# Patient Record
Sex: Male | Born: 1995 | Race: White | Hispanic: No | Marital: Single | Smoking: Never smoker
Health system: Southern US, Community
[De-identification: ages and names within clinical notes are randomized; demographics above are authoritative.]

## PROBLEM LIST (undated history)

## (undated) VITALS — BP 124/63 | HR 83 | Temp 97.5°F | Resp 18 | Ht 72.64 in | Wt 280.0 lb

## (undated) VITALS — BP 137/69 | HR 118 | Temp 97.1°F | Resp 18 | Ht 72.84 in | Wt 260.1 lb

## (undated) DIAGNOSIS — S62109A Fracture of unspecified carpal bone, unspecified wrist, initial encounter for closed fracture: Secondary | ICD-10-CM

## (undated) DIAGNOSIS — R51 Headache: Secondary | ICD-10-CM

## (undated) DIAGNOSIS — F329 Major depressive disorder, single episode, unspecified: Secondary | ICD-10-CM

## (undated) DIAGNOSIS — S82899A Other fracture of unspecified lower leg, initial encounter for closed fracture: Secondary | ICD-10-CM

## (undated) DIAGNOSIS — E669 Obesity, unspecified: Secondary | ICD-10-CM

## (undated) DIAGNOSIS — G930 Cerebral cysts: Secondary | ICD-10-CM

## (undated) DIAGNOSIS — H539 Unspecified visual disturbance: Secondary | ICD-10-CM

## (undated) DIAGNOSIS — F32A Depression, unspecified: Secondary | ICD-10-CM

## (undated) DIAGNOSIS — K760 Fatty (change of) liver, not elsewhere classified: Secondary | ICD-10-CM

## (undated) DIAGNOSIS — H05812 Cyst of left orbit: Secondary | ICD-10-CM

## (undated) DIAGNOSIS — J45909 Unspecified asthma, uncomplicated: Secondary | ICD-10-CM

## (undated) DIAGNOSIS — S2239XA Fracture of one rib, unspecified side, initial encounter for closed fracture: Secondary | ICD-10-CM

## (undated) DIAGNOSIS — F909 Attention-deficit hyperactivity disorder, unspecified type: Secondary | ICD-10-CM

## (undated) DIAGNOSIS — F419 Anxiety disorder, unspecified: Secondary | ICD-10-CM

## (undated) HISTORY — DX: Major depressive disorder, single episode, unspecified: F32.9

## (undated) HISTORY — PX: ADENOIDECTOMY: SUR15

## (undated) HISTORY — DX: Depression, unspecified: F32.A

---
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
## 2016-10-06 ENCOUNTER — Emergency Department (HOSPITAL_COMMUNITY)
Admission: EM | Admit: 2016-10-06 | Discharge: 2016-10-06 | Disposition: A | Discharge status: Other Acute Inpatient Hospital | Payer: Self-pay | Attending: Emergency Medicine | Admitting: Emergency Medicine

## 2016-10-06 ENCOUNTER — Encounter (HOSPITAL_COMMUNITY): Payer: Self-pay | Admitting: Emergency Medicine

## 2016-10-06 ENCOUNTER — Inpatient Hospital Stay (HOSPITAL_COMMUNITY)
Admission: RE | Admit: 2016-10-06 | Discharge: 2016-10-13 | DRG: 885 | Disposition: A | Discharge status: Home / Self Care | Payer: Federal, State, Local not specified - Other | Source: Intra-hospital | Attending: Psychiatry | Admitting: Psychiatry

## 2016-10-06 DIAGNOSIS — F332 Major depressive disorder, recurrent severe without psychotic features: Principal | ICD-10-CM | POA: Diagnosis present

## 2016-10-06 DIAGNOSIS — R45851 Suicidal ideations: Secondary | ICD-10-CM | POA: Insufficient documentation

## 2016-10-06 DIAGNOSIS — Z79899 Other long term (current) drug therapy: Secondary | ICD-10-CM | POA: Diagnosis not present

## 2016-10-06 DIAGNOSIS — F39 Unspecified mood [affective] disorder: Secondary | ICD-10-CM | POA: Diagnosis not present

## 2016-10-06 DIAGNOSIS — F902 Attention-deficit hyperactivity disorder, combined type: Secondary | ICD-10-CM | POA: Diagnosis present

## 2016-10-06 DIAGNOSIS — R45850 Homicidal ideations: Secondary | ICD-10-CM | POA: Insufficient documentation

## 2016-10-06 DIAGNOSIS — Z915 Personal history of self-harm: Secondary | ICD-10-CM

## 2016-10-06 DIAGNOSIS — J4540 Moderate persistent asthma, uncomplicated: Secondary | ICD-10-CM | POA: Diagnosis present

## 2016-10-06 DIAGNOSIS — F419 Anxiety disorder, unspecified: Secondary | ICD-10-CM | POA: Diagnosis not present

## 2016-10-06 DIAGNOSIS — F6381 Intermittent explosive disorder: Secondary | ICD-10-CM | POA: Diagnosis not present

## 2016-10-06 DIAGNOSIS — I10 Essential (primary) hypertension: Secondary | ICD-10-CM | POA: Insufficient documentation

## 2016-10-06 DIAGNOSIS — Z9114 Patient's other noncompliance with medication regimen: Secondary | ICD-10-CM | POA: Insufficient documentation

## 2016-10-06 DIAGNOSIS — F4310 Post-traumatic stress disorder, unspecified: Secondary | ICD-10-CM | POA: Diagnosis present

## 2016-10-06 DIAGNOSIS — R443 Hallucinations, unspecified: Secondary | ICD-10-CM | POA: Diagnosis not present

## 2016-10-06 DIAGNOSIS — J45909 Unspecified asthma, uncomplicated: Secondary | ICD-10-CM | POA: Insufficient documentation

## 2016-10-06 DIAGNOSIS — Z046 Encounter for general psychiatric examination, requested by authority: Secondary | ICD-10-CM | POA: Insufficient documentation

## 2016-10-06 DIAGNOSIS — R450 Nervousness: Secondary | ICD-10-CM | POA: Diagnosis not present

## 2016-10-06 DIAGNOSIS — Z008 Encounter for other general examination: Secondary | ICD-10-CM

## 2016-10-06 DIAGNOSIS — G4700 Insomnia, unspecified: Secondary | ICD-10-CM | POA: Diagnosis not present

## 2016-10-06 DIAGNOSIS — Z888 Allergy status to other drugs, medicaments and biological substances status: Secondary | ICD-10-CM

## 2016-10-06 LAB — SALICYLATE LEVEL: Salicylate Lvl: <7 mg/dL (ref 2.8–30.0)

## 2016-10-06 LAB — COMPREHENSIVE METABOLIC PANEL
ALT: 53 U/L (ref 17–63)
AST: 34 U/L (ref 15–41)
Albumin: 4.2 g/dL (ref 3.5–5.0)
Alkaline Phosphatase: 39 U/L (ref 38–126)
Anion gap: 9 (ref 5–15)
BILIRUBIN TOTAL: 1.2 mg/dL (ref 0.3–1.2)
BUN: 13 mg/dL (ref 6–20)
CO2: 24 mmol/L (ref 22–32)
Calcium: 9.1 mg/dL (ref 8.9–10.3)
Chloride: 104 mmol/L (ref 101–111)
Creatinine, Ser: 0.95 mg/dL (ref 0.61–1.24)
Glucose, Bld: 97 mg/dL (ref 65–99)
Potassium: 3.9 mmol/L (ref 3.5–5.1)
Sodium: 137 mmol/L (ref 135–145)
TOTAL PROTEIN: 7.5 g/dL (ref 6.5–8.1)

## 2016-10-06 LAB — RAPID URINE DRUG SCREEN, HOSP PERFORMED
Amphetamines: NOT DETECTED
Barbiturates: NOT DETECTED
Benzodiazepines: NOT DETECTED
Cocaine: NOT DETECTED
Opiates: NOT DETECTED
Tetrahydrocannabinol: POSITIVE — AB

## 2016-10-06 LAB — CBC
HCT: 41.8 % (ref 39.0–52.0)
Hemoglobin: 14.1 g/dL (ref 13.0–17.0)
MCH: 27.8 pg (ref 26.0–34.0)
MCHC: 33.7 g/dL (ref 30.0–36.0)
MCV: 82.4 fL (ref 78.0–100.0)
PLATELETS: 198 10*3/uL (ref 150–400)
RBC: 5.07 MIL/uL (ref 4.22–5.81)
RDW: 13.5 % (ref 11.5–15.5)
WBC: 7.9 10*3/uL (ref 4.0–10.5)

## 2016-10-06 LAB — ETHANOL

## 2016-10-06 LAB — ACETAMINOPHEN LEVEL: Acetaminophen (Tylenol), Serum: <10 ug/mL — ABNORMAL LOW (ref 10–30)

## 2016-10-06 MED ORDER — TRAZODONE HCL 100 MG PO TABS
100.0000 mg | ORAL_TABLET | Freq: Every evening | ORAL | Status: DC | PRN
  Administered 2016-10-06 – 2016-10-12 (×7): 100 mg via ORAL
  Filled 2016-10-06 (×4): qty 1
  Filled 2016-10-06: qty 7
  Filled 2016-10-06 (×3): qty 1

## 2016-10-06 MED ORDER — MAGNESIUM HYDROXIDE 400 MG/5ML PO SUSP: 30.0000 mL | Freq: Every day | ORAL | Status: DC | PRN

## 2016-10-06 MED ORDER — ACETAMINOPHEN 325 MG PO TABS: 650.0000 mg | ORAL_TABLET | Freq: Four times a day (QID) | ORAL | Status: DC | PRN

## 2016-10-06 MED ORDER — PROPRANOLOL HCL 10 MG PO TABS
10.0000 mg | ORAL_TABLET | Freq: Three times a day (TID) | ORAL | Status: DC
  Administered 2016-10-07 – 2016-10-13 (×17): 10 mg via ORAL
  Filled 2016-10-06 (×23): qty 1

## 2016-10-06 MED ORDER — HYDROXYZINE HCL 25 MG PO TABS
25.0000 mg | ORAL_TABLET | Freq: Four times a day (QID) | ORAL | Status: DC | PRN
  Administered 2016-10-07 – 2016-10-12 (×4): 25 mg via ORAL
  Filled 2016-10-06 (×2): qty 1
  Filled 2016-10-06: qty 10
  Filled 2016-10-06 (×2): qty 1

## 2016-10-06 MED ORDER — ZOLPIDEM TARTRATE 5 MG PO TABS: 5.0000 mg | ORAL_TABLET | Freq: Every evening | ORAL | Status: DC | PRN

## 2016-10-06 MED ORDER — ALBUTEROL SULFATE HFA 108 (90 BASE) MCG/ACT IN AERS: 2.0000 | INHALATION_SPRAY | RESPIRATORY_TRACT | Status: DC | PRN

## 2016-10-06 MED ORDER — PROPRANOLOL HCL 10 MG PO TABS
10.0000 mg | ORAL_TABLET | Freq: Three times a day (TID) | ORAL | Status: DC
  Administered 2016-10-06: 10 mg via ORAL
  Filled 2016-10-06 (×2): qty 1

## 2016-10-06 MED ORDER — TRAZODONE HCL 100 MG PO TABS: 100.0000 mg | ORAL_TABLET | Freq: Every evening | ORAL | Status: DC | PRN

## 2016-10-06 MED ORDER — HYDROXYZINE HCL 25 MG PO TABS: 25.0000 mg | ORAL_TABLET | Freq: Four times a day (QID) | ORAL | Status: DC | PRN

## 2016-10-06 MED ORDER — IBUPROFEN 200 MG PO TABS: 600.0000 mg | ORAL_TABLET | Freq: Three times a day (TID) | ORAL | Status: DC | PRN

## 2016-10-06 MED ORDER — ONDANSETRON HCL 4 MG PO TABS: 4.0000 mg | ORAL_TABLET | Freq: Three times a day (TID) | ORAL | Status: DC | PRN

## 2016-10-06 MED ORDER — ALBUTEROL SULFATE HFA 108 (90 BASE) MCG/ACT IN AERS
2.0000 | INHALATION_SPRAY | RESPIRATORY_TRACT | Status: DC | PRN
  Filled 2016-10-06: qty 6.7

## 2016-10-06 MED ORDER — ALUM & MAG HYDROXIDE-SIMETH 200-200-20 MG/5ML PO SUSP: 30.0000 mL | Freq: Four times a day (QID) | ORAL | Status: DC | PRN
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
## 2016-10-06 NOTE — BH Assessment (Addendum)
Patient meets criteria for INPT treatment. [facility] Elta GuadeloupeLaurie Parks, NP. Room assignment is 403-1 after 1800. Support paperwork completed.
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
## 2016-10-06 NOTE — ED Notes (Signed)
Pt transported to [facility] by [facility]. All belongings returned to pt who signed for same. Pt was calm and cooperative.  

## 2016-10-07 ENCOUNTER — Encounter (HOSPITAL_COMMUNITY): Payer: Self-pay | Admitting: *Deleted

## 2016-10-07 DIAGNOSIS — F332 Major depressive disorder, recurrent severe without psychotic features: Principal | ICD-10-CM

## 2016-10-07 DIAGNOSIS — R45851 Suicidal ideations: Secondary | ICD-10-CM

## 2016-10-07 DIAGNOSIS — F6381 Intermittent explosive disorder: Secondary | ICD-10-CM

## 2016-10-07 DIAGNOSIS — Z62810 Personal history of physical and sexual abuse in childhood: Secondary | ICD-10-CM

## 2016-10-07 DIAGNOSIS — R45850 Homicidal ideations: Secondary | ICD-10-CM

## 2016-10-07 MED ORDER — CARBAMAZEPINE 200 MG PO TABS
200.0000 mg | ORAL_TABLET | Freq: Two times a day (BID) | ORAL | Status: DC
  Administered 2016-10-07 – 2016-10-13 (×12): 200 mg via ORAL
  Filled 2016-10-07 (×14): qty 1

## 2016-10-07 MED ORDER — QUETIAPINE FUMARATE 50 MG PO TABS
50.0000 mg | ORAL_TABLET | Freq: Every day | ORAL | Status: DC
  Administered 2016-10-07 – 2016-10-08 (×2): 50 mg via ORAL
  Filled 2016-10-07 (×4): qty 1

## 2016-10-07 MED ORDER — SERTRALINE HCL 50 MG PO TABS
50.0000 mg | ORAL_TABLET | Freq: Every day | ORAL | Status: DC
  Administered 2016-10-07 – 2016-10-13 (×7): 50 mg via ORAL
  Filled 2016-10-07 (×9): qty 1

## 2016-10-07 MED ORDER — QUETIAPINE FUMARATE 25 MG PO TABS
25.0000 mg | ORAL_TABLET | Freq: Two times a day (BID) | ORAL | Status: DC
  Administered 2016-10-07 – 2016-10-13 (×12): 25 mg via ORAL
  Filled 2016-10-07 (×15): qty 1

## 2016-10-07 MED ORDER — IBUPROFEN 600 MG PO TABS
600.0000 mg | ORAL_TABLET | Freq: Four times a day (QID) | ORAL | Status: DC | PRN
  Administered 2016-10-07 – 2016-10-10 (×5): 600 mg via ORAL
  Filled 2016-10-07 (×5): qty 1

## 2016-10-07 NOTE — Plan of Care (Signed)
Problem: Education: Goal: Verbalization of understanding the information provided will improve Outcome: Progressing Patient verbalizes understanding of information, education provided thus far.  Problem: Safety: Goal: Periods of time without injury will increase Outcome: Progressing Patient does endorse passive SI however no thoughts to self harm. Verbal contract in place for safety.

## 2016-10-07 NOTE — [facility] Group Notes (Signed)
LCSW Group Therapy Note  10/07/2016 1:15pm  Type of Therapy/Topic:  Group Therapy:  Balance in Life  Participation Level:  Did Not Attend-pt invited. Chose to remain in his room.   Description of Group:    This group will address the concept of balance and how it feels and looks when one is unbalanced. Patients will be encouraged to process areas in their lives that are out of balance and identify reasons for remaining unbalanced. Facilitators will guide patients in utilizing problem-solving interventions to address and correct the stressor making their life unbalanced. Understanding and applying boundaries will be explored and addressed for obtaining and maintaining a balanced life. Patients will be encouraged to explore ways to assertively make their unbalanced needs known to significant others in their lives, using other group members and facilitator for support and feedback.  Therapeutic Goals: 1. Patient will identify two or more emotions or situations they have that consume much of in their lives. 2. Patient will identify signs/triggers that life has become out of balance:  3. Patient will identify two ways to set boundaries in order to achieve balance in their lives:  4. Patient will demonstrate ability to communicate their needs through discussion and/or role plays  Summary of Patient Progress:      Therapeutic Modalities:   Cognitive Behavioral Therapy Solution-Focused Therapy Assertiveness Training  Ledell PeoplesHeather N Smart, LCSW 10/07/2016 1:59 PM

## 2016-10-07 NOTE — Progress Notes (Signed)
D: Patient observed up and visible in the milieu. Pacing and restless at times. Frequent contacts made 1:1 throughout morning. Patient verbalizes to this writer mid morning, "I'm starting to shake inside. That usually means it's imminent." Patient walking and talking with peer in hall who is also anxious and asking for prns. Patient was observed socializing, playing cards with peers a short time prior to this reported anxiety. Patient is childlike, thinking is concrete. Patient's affect animated, anxious with congruent mood. Per self inventory and discussions with writer, rates depression at a 7/10, hopelessness at a 1/10 and anxiety at a 3/10. Rates sleep as fair, appetite as good, energy as normal and concentration as poor.  States goal for today is "anger control, find different ways to cope." Reporting chronic L shoulder pain of a 5/10, no other physical problems.   A: Medicated per orders, prn tylenol offered but patient refuses. Vistaril was given for anxiety. Strongly encouraged use of coping skills and education provided on available techniques. Level III obs in place for safety. Emotional support offered and self inventory reviewed. Encouraged completion of Suicide Safety Plan and programming participation. Discussed POC with MD, SW.  R: Patient verbalizes understanding of POC. On reassess, patient calmer and did go outside with peers. Patient endorsing passive SI however verbally contracts for safety. "I will come talk to someone." Continues to endorse thoughts of harming family. No AVH and remains safe on level III obs. Will continue to monitor closely and make verbal contact frequently.

## 2016-10-07 NOTE — [facility] Suicide Risk Assessment (Signed)
[facility]   Nursing information obtained from:  Family Demographic factors:  Male, Adolescent or young adult, Caucasian, Low socioeconomic status Current Mental Status:  Suicidal ideation indicated by patient, Self-harm thoughts, Self-harm behaviors Loss Factors:  Financial problems / change in socioeconomic status Historical Factors:  Prior suicide attempts Risk Reduction Factors:  Pregnancy  Total Time spent with patient: 45 minutes Principal Problem:  MDD, no psychotic features, consider Intermittent Explosive Disorder  Diagnosis:   Patient Active Problem List   Diagnosis Date Noted  . MDD (major depressive disorder), recurrent severe, without psychosis (HCC) [F33.2] 10/06/2016  . Intermittent explosive disorder [F63.81] 09/30/2015  . Moderate persistent asthma [J45.40] 12/20/2014  . Other allergic rhinitis [J30.89] 12/20/2014  . PTSD (post-traumatic stress disorder) [F43.10] 04/22/2012  . ADHD (attention deficit hyperactivity disorder), combined type [F90.2] 04/19/2012  . ODD (oppositional defiant disorder) [F91.3] 04/19/2012    Continued Clinical Symptoms:  Alcohol Use Disorder Identification Test Final Score (AUDIT): 0 The "Alcohol Use Disorders Identification Test", Guidelines for Use in Primary Care, Second Edition.  World [employer] [facility](WHO). Score between 0-7:  no or low risk or alcohol related problems. Score between 8-15:  moderate risk of alcohol related problems. Score between 16-19:  high risk of alcohol related problems. Score 20 or above:  warrants further diagnostic evaluation for alcohol dependence and treatment.   CLINICAL FACTORS:  21 year old single male, presents voluntarily due to worsening depression,neuro-vegetative symptoms, and suicidal and homicidal ideations.  Reports SI and HI towards family members ( mother, great - grandparents ) History of prior psychiatric admissions. Has been off psychiatric medications for  several months  Denies drug or alcohol abuse     Psychiatric Specialty Exam: Physical Exam  ROS  Blood pressure (!) 127/107, pulse (!) 112, temperature (!) 97.5 F (36.4 C), temperature source Oral, resp. rate 18, height 6' (1.829 m), weight (!) 147.9 kg (326 lb).Body mass index is 44.21 kg/m.   see admit note MSE     COGNITIVE FEATURES THAT CONTRIBUTE TO RISK:  Closed-mindedness and Loss of executive function    SUICIDE RISK:   Moderate:  Frequent suicidal ideation with limited intensity, and duration, some specificity in terms of plans, no associated intent, good self-control, limited dysphoria/symptomatology, some risk factors present, and identifiable protective factors, including available and accessible social support.  PLAN OF CARE: Patient will be admitted to inpatient psychiatric unit for stabilization and safety. Will provide and encourage milieu participation. Provide medication management and maked adjustments as needed.  Will follow daily.    I certify that inpatient services furnished can reasonably be expected to improve the patient's condition.   Craige CottaFernando A Ira Busbin, MD 10/07/2016, 11:38 AM

## 2016-10-07 NOTE — Progress Notes (Signed)
Recreation Therapy Notes  Date: 10/07/16 Time: 0930 Location: [address] Dayroom  Group Topic: Stress Management  Goal Area(s) Addresses:  Patient will verbalize importance of using healthy stress management.  Patient will identify positive emotions associated with healthy stress management.   Behavioral Response: Engaged  Intervention: Stress Management  Activity :  [employer] [employer].  LRT introduced the stress management technique of guided imagery.  LRT read a script to allow patients to engage in a mental vacation in the wilderness.  Patients were to follow along as the script was read to participate in the activity.  Education:  Stress Management, Discharge Planning.   Education Outcome: Acknowledges edcuation/In group clarification offered/Needs additional education  Clinical Observations/Feedback: Pt attended group.   Caroll RancherMarjette Karianna Gusman, LRT/CTRS         Lillia AbedLindsay, Yamilka Lopiccolo A 10/07/2016 1:12 PM

## 2016-10-07 NOTE — Tx Team (Signed)
Initial Treatment Plan 10/07/2016 1:55 AM Nicholas Rollins Nicholas Rollins [id number]    PATIENT STRESSORS: Educational concerns Financial difficulties Marital or family conflict Medication change or noncompliance   PATIENT STRENGTHS: Ability for insight Average or above average intelligence Communication skills Motivation for treatment/growth   PATIENT IDENTIFIED PROBLEMS: At risk for suicide  "Getting control of Multiple Personality before"  "Cutting suicide thoughts down"                 DISCHARGE CRITERIA:  Ability to meet basic life and health needs Adequate post-discharge living arrangements Improved stabilization in mood, thinking, and/or behavior Motivation to continue treatment in a less acute level of care Need for constant or close observation no longer present  PRELIMINARY DISCHARGE PLAN: Outpatient therapy Return to previous living arrangement  PATIENT/FAMILY INVOLVEMENT: This treatment plan has been presented to and reviewed with the patient, Nicholas Rollins.  The patient and family have been given the opportunity to ask questions and make suggestions.  Carleene OverlieMiddleton, Manpreet Strey P, RN 10/07/2016, 1:55 AM
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
## 2016-10-08 LAB — HEMOGLOBIN A1C
Hgb A1c MFr Bld: 5 % (ref 4.8–5.6)
MEAN PLASMA GLUCOSE: 96.8 mg/dL

## 2016-10-08 LAB — LIPID PANEL
CHOL/HDL RATIO: 4.1 ratio
Cholesterol: 138 mg/dL (ref 0–200)
HDL: 34 mg/dL — AB (ref >40)
LDL Cholesterol: 78 mg/dL (ref 0–99)
Triglycerides: 130 mg/dL (ref <150)
VLDL: 26 mg/dL (ref 0–40)

## 2016-10-08 LAB — TSH: TSH: 2.739 u[IU]/mL (ref 0.350–4.500)

## 2016-10-08 NOTE — Progress Notes (Signed)
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 Pt often talks about his personal psychiatric history and warns others he can "go off" at anytime but seen joyously socializing with peers. In regards to needs vs wants, pt reports needing more money to pay his bills and wanting a [employer] twenty piece nugget. Pt feels he could be a better version of himself without his anxiety/panic attacks. Pt was first to share and left group shortly there after.   Nicholas CapersMcNeil, [name] 10/08/2016, 3:15 AM

## 2016-10-08 NOTE — Progress Notes (Addendum)
D: Patient observed up and restless on unit. Complains of severe pain and anxiety however is frequently observed playing cards and socially interacting with peers. Frequent contacts made 1:1 throughout shift.  Patient's affect animated, mood anxious. Per self inventory and discussions with writer, rates depression at an 8/10, hopelessness at a 0/10 and anxiety at a 10/10. Rates sleep as good, appetite as good, energy as normal and concentration as good.  States goal for today is "my anxiety, speak to doctor about it."   A: Medicated per orders, prn advil given for discomfort.  Level III obs in place for safety. Emotional support offered and self inventory reviewed. Encouraged completion of Suicide Safety Plan and programming participation. Discussed POC with MD, SW. Fall prevention plan in place as patient is a high fall risk due to complaints of dizziness and unsteadiness. (VSS)  R: Patient verbalizes understanding of POC, falls prevention education. On reassess, patient's pain resolved after AM advil, only slightly decreased this evening to a 7/10. Patient continues to endorse passive SI/HI (toward family) - "yes, I think about it but it's there all the time, everyday. I'm not going to act on it. Not here, not at home."  Verbal contract for safety in place should that change. Denies AVH and remains safe on level III obs.

## 2016-10-08 NOTE — Progress Notes (Signed)
Patient attended group and said that his day was a 5. His coping skill for today were sleeping, tv, and drawing

## 2016-10-08 NOTE — Plan of Care (Signed)
Problem: Coping: Goal: Ability to identify and develop effective coping behavior will improve Outcome: Not Progressing Patient remains med focused. Unreceptive to development and encouragement by this writer to develop coping strategies.  Problem: Education: Goal: Knowledge of [facility] Education information/materials will improve Outcome: Completed/Met Date Met: 10/08/16 Patient verbalizes understanding of information, education provided.

## 2016-10-08 NOTE — Progress Notes (Signed)
At the beginning of the shift, pt reports he is still very anxious and states that he feels he could "go off" at any minute, but he does not give the appearance of being anxious.  Just before talking with Clinical research associatewriter, he was in the dayroom with peers playing cards.  He states he is still SI and HI which "never goes away".  Pt contracts for safety on the unit.  He does deny having AVH this evening.  He went to evening group, but came out after a few minutes stating that he was very tired and groggy.  Writer asked him if he needed to go lie down, and he stated, "no, I don't think so".  MHT reports that pt tends to be grandiose about his history when he is talking to peers and staff.  He requested Ibuprofen for his chronic L shoulder and L leg pain which was received and given with his HS meds.  Pt has been cooperative and appropriate with staff this evening with no outbursts of anger that pt states he has at home.  Pt makes his needs known to staff.  Support and encouragement offered.  Discharge plans are in process.  Safety maintained with q15 minute checks.

## 2016-10-08 NOTE — [facility] Group Notes (Signed)
LCSW Group Therapy Note  10/08/2016 1:15pm  Type of Therapy/Topic:  Group Therapy:  Emotion Regulation  Participation Level:  Minimal   Description of Group:   The purpose of this group is to assist patients in learning to regulate negative emotions and experience positive emotions. Patients will be guided to discuss ways in which they have been vulnerable to their negative emotions. These vulnerabilities will be juxtaposed with experiences of positive emotions or situations, and patients will be challenged to use positive emotions to combat negative ones. Special emphasis will be placed on coping with negative emotions in conflict situations, and patients will process healthy conflict resolution skills.  Therapeutic Goals: 1. Patient will identify two positive emotions or experiences to reflect on in order to balance out negative emotions 2. Patient will label two or more emotions that they find the most difficult to experience 3. 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 Pt was playing cards most of the time during group session.    Therapeutic Modalities:   Cognitive Behavioral Therapy Feelings Identification Dialectical Behavioral Therapy   Verdene LennertLauren C Beckey Polkowski, LCSW 10/08/2016 3:20 PM
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
## 2016-10-08 NOTE — Progress Notes (Signed)
Nutrition Education Note  Pt attended group focusing on general, healthful nutrition education.  RD emphasized the importance of eating regular meals and snacks throughout the day. Consuming sugar-free beverages and incorporating fruits and vegetables into diet when possible. Provided examples of healthy snacks. Patient encouraged to leave group with a goal to improve nutrition/healthy eating.   Diet Order:   Pt is also offered choice of unit snacks mid-morning and mid-afternoon.  Pt is eating as desired.   If additional nutrition issues arise, please consult RD.    Trenton GammonJessica Camyra Vaeth, MS, RD, LDN, [facility] Inpatient Clinical Dietitian Pager # [phone] After hours/weekend pager # [phone]

## 2016-10-09 DIAGNOSIS — G4700 Insomnia, unspecified: Secondary | ICD-10-CM

## 2016-10-09 DIAGNOSIS — F39 Unspecified mood [affective] disorder: Secondary | ICD-10-CM

## 2016-10-09 LAB — PROLACTIN: PROLACTIN: 20.9 ng/mL — AB (ref 4.0–15.2)

## 2016-10-09 MED ORDER — QUETIAPINE FUMARATE 100 MG PO TABS
100.0000 mg | ORAL_TABLET | Freq: Every day | ORAL | Status: DC
Start: 2016-10-09 — End: 2016-10-13
  Administered 2016-10-09 – 2016-10-12 (×4): 100 mg via ORAL
  Filled 2016-10-09 (×8): qty 1

## 2016-10-09 NOTE — [facility] Group Notes (Signed)
LCSW Group Therapy Note  10/09/2016 1:15pm  Type of Therapy and Topic:  Group Therapy:  Feelings around Relapse and Recovery  Participation Level:  Did Not Attend -pt chose to remain in bed.   Description of Group:    Patients in this group will discuss emotions they experience before and after a relapse. They will process how experiencing these feelings, or avoidance of experiencing them, relates to having a relapse. Facilitator will guide patients to explore emotions they have related to recovery. Patients will be encouraged to process which emotions are more powerful. They will be guided to discuss the emotional reaction significant others in their lives may have to their relapse or recovery. Patients will be assisted in exploring ways to respond to the emotions of others without this contributing to a relapse.  Therapeutic Goals: 1. Patient will identify two or more emotions that lead to a relapse for them 2. Patient will identify two emotions that result when they relapse 3. Patient will identify two emotions related to recovery 4. Patient will demonstrate ability to communicate their needs through discussion and/or role plays   Summary of Patient Progress:  x   Therapeutic Modalities:   Cognitive Behavioral Therapy Solution-Focused Therapy Assertiveness Training Relapse Prevention Therapy   Ledell PeoplesHeather N Smart, LCSW 10/09/2016 1:08 PM

## 2016-10-09 NOTE — Progress Notes (Signed)
[facility]  10/09/2016 3:18 PM Nicholas LoanCodie L Rollins  MRN:  [id number]   Subjective:  Patient states that he is doing ok. He reports feeling weak earlier and had to come lay down. He admits to some chronic HI , but states "they're not my thoughts. They come from my other personalities and jump into my thoughts." He states that he doesn't like having these thoughts, but they have always been there. He agrees to a medication increase to help with that. He denies any SI.  Objective: Patient seems to be psycho-somatic and picks up problems from other patients in the day room. He was cooperative, but had a flat affect appears depressed. Patient will have his Seroquel increased to assist with his reported agitation and depression.   Principal Problem: MDD (major depressive disorder), recurrent severe, without psychosis (HCC) Diagnosis:   Patient Active Problem List   Diagnosis Date Noted  . MDD (major depressive disorder), recurrent severe, without psychosis (HCC) [F33.2] 10/06/2016  . Intermittent explosive disorder [F63.81] 09/30/2015  . Moderate persistent asthma [J45.40] 12/20/2014  . Other allergic rhinitis [J30.89] 12/20/2014  . PTSD (post-traumatic stress disorder) [F43.10] 04/22/2012  . ADHD (attention deficit hyperactivity disorder), combined type [F90.2] 04/19/2012  . ODD (oppositional defiant disorder) [F91.3] 04/19/2012   Total Time spent with patient: 25 minutes  Past Psychiatric History: See H&P  Past Medical History:  Past Medical History:  Diagnosis Date  . ADHD (attention deficit hyperactivity disorder)   . Anxiety   . Asthma   . Broken ankle 2013   Left ankle  . Broken wrist 2012   Left wrist  . Cyst of brain 2012   Seen by Dr. Lorenso CourierPowers, cleared for sports, found during work-up for severe headahces.  FOund on either MRI or CT scan.   . Cyst of left orbit    Possible cyst of left eye; observed during routine eye exam fall 2013, "right next to nerve", was supposed to  get a follow-up 03/2012.  . Depression   . Fatty liver November 2013   resolved with healthier nutrition and increased physical activity  . Fractured rib 2011  . Headache(784.0)    history of migraines, resolved this with improved overall health  . Obesity   . Vision abnormalities    myopia    Past Surgical History:  Procedure Laterality Date  . ADENOIDECTOMY     21yo   Family History: History reviewed. No pertinent family history. Family Psychiatric  History: See H&P Social History:  History  Alcohol Use No     History  Drug Use No    Social History   Social History  . Marital status: Single    Spouse name: N/A  . Number of children: N/A  . Years of education: N/A   Social History Main Topics  . Smoking status: Never Smoker  . Smokeless tobacco: Never Used  . Alcohol use No  . Drug use: No  . Sexual activity: No   Other Topics Concern  . None   Social History Narrative  . None   Additional Social History:                         Sleep: Good  Appetite:  Good  Current Medications: Current Facility-Administered Medications  Medication Dose Route Frequency Provider Last Rate Last Dose  . acetaminophen (TYLENOL) tablet 650 mg  650 mg Oral Q6H PRN Laveda AbbeParks, Laurie Britton, NP      . albuterol (  PROVENTIL HFA;VENTOLIN HFA) 108 (90 Base) MCG/ACT inhaler 2 puff  2 puff Inhalation Q4H PRN Laveda Abbe, NP      . carbamazepine (TEGRETOL) tablet 200 mg  200 mg Oral BID Cobos, Rockey Situ, MD   200 mg at 10/09/16 0855  . hydrOXYzine (ATARAX/VISTARIL) tablet 25 mg  25 mg Oral Q6H PRN Laveda Abbe, NP   25 mg at 10/07/16 1045  . ibuprofen (ADVIL,MOTRIN) tablet 600 mg  600 mg Oral Q6H PRN Kerry Hough, PA-C   600 mg at 10/08/16 1717  . magnesium hydroxide (MILK OF MAGNESIA) suspension 30 mL  30 mL Oral Daily PRN Laveda Abbe, NP      . propranolol (INDERAL) tablet 10 mg  10 mg Oral TID Laveda Abbe, NP   10 mg at 10/09/16 0855   . QUEtiapine (SEROQUEL) tablet 100 mg  100 mg Oral QHS Money, Gerlene Burdock, FNP      . QUEtiapine (SEROQUEL) tablet 25 mg  25 mg Oral BID Cobos, Rockey Situ, MD   25 mg at 10/09/16 0856  . sertraline (ZOLOFT) tablet 50 mg  50 mg Oral Daily Cobos, Rockey Situ, MD   50 mg at 10/09/16 0856  . traZODone (DESYREL) tablet 100 mg  100 mg Oral QHS PRN Laveda Abbe, NP   100 mg at 10/08/16 2112    Lab Results:  Results for orders placed or performed during the hospital encounter of 10/06/16 (from the past 48 hour(s))  Prolactin     Status: Abnormal   Collection Time: 10/08/16  6:17 AM  Result Value Ref Range   Prolactin 20.9 (H) 4.0 - 15.2 ng/mL    Comment: (NOTE) Performed At: [facility] [address] [place], [place] [id number] Mila Homer MD [id number] Performed at [facility], [address] [place], [place] [address]   Hemoglobin A1c     Status: None   Collection Time: 10/08/16  6:17 AM  Result Value Ref Range   Hgb A1c MFr Bld 5.0 4.8 - 5.6 %    Comment: (NOTE) Pre diabetes:          5.7%-6.4% Diabetes:              >6.4% Glycemic control for   <7.0% adults with diabetes    Mean Plasma Glucose 96.8 mg/dL    Comment: Performed at [facility], [address], [place], [place] [address]  Lipid panel     Status: Abnormal   Collection Time: 10/08/16  6:17 AM  Result Value Ref Range   Cholesterol 138 0 - 200 mg/dL   Triglycerides 784 <696 mg/dL   HDL 34 (L) >29 mg/dL   Total CHOL/HDL Ratio 4.1 RATIO   VLDL 26 0 - 40 mg/dL   LDL Cholesterol 78 0 - 99 mg/dL    Comment:        Total Cholesterol/HDL:CHD Risk Coronary Heart Disease Risk Table                     Men   Women  1/2 Average Risk   3.4   3.3  Average Risk       5.0   4.4  2 X Average Risk   9.6   7.1  3 X Average Risk  23.4   11.0        Use the calculated Patient Ratio above and the CHD Risk Table to determine the patient's CHD Risk.  ATP III CLASSIFICATION  (LDL):  <100     mg/dL   Optimal  308-657  mg/dL   Near or Above                    Optimal  130-159  mg/dL   Borderline  846-962  mg/dL   High  >952     mg/dL   Very High Performed at [facility], [address] [place], [place] [address]   TSH     Status: None   Collection Time: 10/08/16  6:17 AM  Result Value Ref Range   TSH 2.739 0.350 - 4.500 uIU/mL    Comment: Performed by a 3rd Generation assay with a functional sensitivity of <=0.01 uIU/mL. Performed at [facility], [address], [place], [place] [address]     Blood Alcohol level:  Lab Results  Component Value Date   [employer] <5 10/06/2016   ETH <5 09/27/2015    Metabolic Disorder Labs: Lab Results  Component Value Date   HGBA1C 5.0 10/08/2016   MPG 96.8 10/08/2016   MPG 114 09/23/2013   Lab Results  Component Value Date   PROLACTIN 20.9 (H) 10/08/2016   PROLACTIN 5.8 09/23/2013   Lab Results  Component Value Date   CHOL 138 10/08/2016   TRIG 130 10/08/2016   HDL 34 (L) 10/08/2016   CHOLHDL 4.1 10/08/2016   VLDL 26 10/08/2016   LDLCALC 78 10/08/2016   LDLCALC 90 09/23/2013    Physical Findings: AIMS: Facial and Oral Movements Muscles of Facial Expression: None, normal Lips and Perioral Area: None, normal Jaw: None, normal Tongue: None, normal,Extremity Movements Upper (arms, wrists, hands, fingers): None, normal Lower (legs, knees, ankles, toes): None, normal, Trunk Movements Neck, shoulders, hips: None, normal, Overall Severity Severity of abnormal movements (highest score from questions above): None, normal Incapacitation due to abnormal movements: None, normal Patient's awareness of abnormal movements (rate only patient's report): No Awareness, Dental Status Current problems with teeth and/or dentures?: No Does patient usually wear dentures?: No  CIWA:    COWS:     Musculoskeletal: Strength & Muscle Tone: within normal limits Gait & Station: normal Patient leans:  Right  Psychiatric Specialty Exam: Physical Exam  Nursing note and vitals reviewed. Constitutional: He is oriented to person, place, and time. He appears well-developed and well-nourished.  Cardiovascular: Normal rate.   Respiratory: Effort normal.  Musculoskeletal: Normal range of motion.  Neurological: He is alert and oriented to person, place, and time.  Skin: Skin is warm.    Review of Systems  Constitutional: Negative.   HENT: Negative.   Eyes: Negative.   Respiratory: Negative.   Cardiovascular: Negative.   Gastrointestinal: Negative.   Genitourinary: Negative.   Musculoskeletal: Negative.   Skin: Negative.   Neurological: Negative.   Endo/Heme/Allergies: Negative.     Blood pressure 132/69, pulse 87, temperature 97.6 F (36.4 C), temperature source Oral, resp. rate 16, height 6' (1.829 m), weight (!) 147.9 kg (326 lb).Body mass index is 44.21 kg/m.  General Appearance: Casual  Eye Contact:  Minimal  Speech:  Clear and Coherent  Volume:  Decreased  Mood:  Depressed  Affect:  Depressed and Flat  Thought Process:  Disorganized and Descriptions of Associations: Circumstantial  Orientation:  Full (Time, Place, and Person)  Thought Content:  Delusions and Rumination  Suicidal Thoughts:  No  Homicidal Thoughts:  Yes.  without intent/plan  Memory:  Immediate;   Fair Recent;   Fair  Judgement:  Fair  Insight:  Fair  Psychomotor Activity:  Normal  Concentration:  Concentration: Fair and Attention Span: Fair  Recall:  [employer] of Knowledge:  Fair  Language:  Good  Akathisia:  No  Handed:  Right  AIMS (if indicated):     Assets:  Housing Social Support Talents/Skills  ADL's:  Intact  Cognition:  WNL  Sleep:  Number of Hours: 6.75     Treatment Plan Summary: Daily contact with patient to assess and evaluate symptoms and progress in treatment, Medication management and Plan is to:  -Increase Seroquel 100 mg PO QHS and 25 mg PO Daily for mood  stability -Continue Zoloft 50 mg PO Daily for mood stability -Continue Trazodone 100 mg PO QHS PRN for insomnia -Continue Tegretol 200 mg PO BID for mood stability -Continue Vistaril 25 mg PO Daily.  -Encourage group therapy participation  Maryfrances Bunnell, FNP 10/09/2016, 3:18 PM  Agree with NP Progress Note

## 2016-10-09 NOTE — Progress Notes (Signed)
Adult Psychoeducational Group Note  Date:  10/09/2016 Time:  4:16 PM  Group Topic/Focus:  Building Self Esteem:   The Focus of this group is helping patients become aware of the effects of self-esteem on their lives, the things they and others do that enhance or undermine their self-esteem, seeing the relationship between their level of self-esteem and the choices they make and learning ways to enhance self-esteem.  Participation Level:  Active  Participation Quality:  Appropriate  Affect:  Appropriate  Cognitive:  Appropriate  Insight: Improving  Engagement in Group:  Engaged  Modes of Intervention:  Activity  Additional Comments:  Pt did participate in all activities and groups today. Brenten Janney R Sumeya Yontz 10/09/2016, 4:16 PM

## 2016-10-09 NOTE — Tx Team (Signed)
Interdisciplinary Treatment and Diagnostic Plan Update  10/09/2016 Time of Session: 0830AM BARTON WANT MRN: [id number]  Principal Diagnosis: MDD recurrent, severe  Secondary Diagnoses: Active Problems:   MDD (major depressive disorder), recurrent severe, without psychosis (HCC)   Current Medications:  Current Facility-Administered Medications  Medication Dose Route Frequency Provider Last Rate Last Dose  . acetaminophen (TYLENOL) tablet 650 mg  650 mg Oral Q6H PRN Laveda Abbe, NP      . albuterol (PROVENTIL HFA;VENTOLIN HFA) 108 (90 Base) MCG/ACT inhaler 2 puff  2 puff Inhalation Q4H PRN Laveda Abbe, NP      . carbamazepine (TEGRETOL) tablet 200 mg  200 mg Oral BID Cobos, Rockey Situ, MD   200 mg at 10/08/16 1714  . hydrOXYzine (ATARAX/VISTARIL) tablet 25 mg  25 mg Oral Q6H PRN Laveda Abbe, NP   25 mg at 10/07/16 1045  . ibuprofen (ADVIL,MOTRIN) tablet 600 mg  600 mg Oral Q6H PRN Kerry Hough, PA-C   600 mg at 10/08/16 1717  . magnesium hydroxide (MILK OF MAGNESIA) suspension 30 mL  30 mL Oral Daily PRN Laveda Abbe, NP      . propranolol (INDERAL) tablet 10 mg  10 mg Oral TID Laveda Abbe, NP   10 mg at 10/08/16 1714  . QUEtiapine (SEROQUEL) tablet 25 mg  25 mg Oral BID Cobos, Rockey Situ, MD   25 mg at 10/08/16 1714  . QUEtiapine (SEROQUEL) tablet 50 mg  50 mg Oral QHS Cobos, Rockey Situ, MD   50 mg at 10/08/16 2112  . sertraline (ZOLOFT) tablet 50 mg  50 mg Oral Daily Cobos, Rockey Situ, MD   50 mg at 10/08/16 0810  . traZODone (DESYREL) tablet 100 mg  100 mg Oral QHS PRN Laveda Abbe, NP   100 mg at 10/08/16 2112   PTA Medications: Prescriptions Prior to Admission  Medication Sig Dispense Refill Last Dose  . albuterol (PROAIR HFA) 108 (90 BASE) MCG/ACT inhaler Inhale 2 puffs into the lungs every 4 (four) hours as needed for wheezing or shortness of breath. (Patient not taking: Reported on 10/06/2016) 1 Inhaler 3 Not Taking  at Unknown time  . carbamazepine (TEGRETOL) 200 MG tablet Take 1 tablet (200 mg total) by mouth 2 (two) times daily. (Patient not taking: Reported on 10/06/2016) 60 tablet 0 Not Taking at Unknown time  . cetirizine (ZYRTEC) 10 MG tablet ONE TABLET ONCE A DAY FOR RUNNY NOSE OR ITCHING. (Patient not taking: Reported on 09/27/2015) 30 tablet 5 Not Taking at Unknown time  . citalopram (CELEXA) 10 MG tablet Take 3 tablets (30 mg total) by mouth daily. (Patient not taking: Reported on 10/06/2016) 30 tablet 0 Not Taking at Unknown time  . propranolol (INDERAL) 10 MG tablet Take 1 tablet (10 mg total) by mouth 3 (three) times daily. 30 tablet 0 Past Week at Unknown time  . QUEtiapine (SEROQUEL) 100 MG tablet Take 1 tablet (100 mg total) by mouth at bedtime. (Patient not taking: Reported on 10/06/2016) 30 tablet 0 Not Taking at Unknown time  . traZODone (DESYREL) 50 MG tablet Take 1 tablet (50 mg total) by mouth at bedtime as needed for sleep. (Patient not taking: Reported on 10/06/2016) 30 tablet 0 Not Taking at Unknown time    Patient Stressors: Educational concerns Financial difficulties Marital or family conflict Medication change or noncompliance  Patient Strengths: Ability for insight Average or above average Chief Operating Officer Motivation for treatment/growth  Treatment Modalities: Medication Management, Group  therapy, Case management,  1 to 1 session with clinician, Psychoeducation, Recreational therapy.   Physician Treatment Plan for Primary Diagnosis: MDD recurrent, severe Long Term Goal(s): Improvement in symptoms so as ready for discharge Improvement in symptoms so as ready for discharge   Short Term Goals: Ability to identify changes in lifestyle to reduce recurrence of condition will improve Ability to maintain clinical measurements within normal limits will improve Compliance with prescribed medications will improve Ability to identify changes in lifestyle to reduce  recurrence of condition will improve Ability to maintain clinical measurements within normal limits will improve Compliance with prescribed medications will improve  Medication Management: Evaluate patient's response, side effects, and tolerance of medication regimen.  Therapeutic Interventions: 1 to 1 sessions, Unit Group sessions and Medication administration.  Evaluation of Outcomes: Progressing  Physician Treatment Plan for Secondary Diagnosis: Active Problems:   MDD (major depressive disorder), recurrent severe, without psychosis (HCC)  Long Term Goal(s): Improvement in symptoms so as ready for discharge Improvement in symptoms so as ready for discharge   Short Term Goals: Ability to identify changes in lifestyle to reduce recurrence of condition will improve Ability to maintain clinical measurements within normal limits will improve Compliance with prescribed medications will improve Ability to identify changes in lifestyle to reduce recurrence of condition will improve Ability to maintain clinical measurements within normal limits will improve Compliance with prescribed medications will improve     Medication Management: Evaluate patient's response, side effects, and tolerance of medication regimen.  Therapeutic Interventions: 1 to 1 sessions, Unit Group sessions and Medication administration.  Evaluation of Outcomes: Progressing   RN Treatment Plan for Primary Diagnosis: MDD recurrent, severe Long Term Goal(s): Knowledge of disease and therapeutic regimen to maintain health will improve  Short Term Goals: Ability to verbalize frustration and anger appropriately will improve, Ability to verbalize feelings will improve and Ability to disclose and discuss suicidal ideas  Medication Management: RN will administer medications as ordered by provider, will assess and evaluate patient's response and provide education to patient for prescribed medication. RN will report any adverse  and/or side effects to prescribing provider.  Therapeutic Interventions: 1 on 1 counseling sessions, Psychoeducation, Medication administration, Evaluate responses to treatment, Monitor vital signs and CBGs as ordered, Perform/monitor CIWA, COWS, AIMS and Fall Risk screenings as ordered, Perform wound care treatments as ordered.  Evaluation of Outcomes: Progressing   LCSW Treatment Plan for Primary Diagnosis: MDD recurrent, severe Long Term Goal(s): Safe transition to appropriate next level of care at discharge, Engage patient in therapeutic group addressing interpersonal concerns.  Short Term Goals: Increase ability to appropriately verbalize feelings, Facilitate patient progression through stages of change regarding substance use diagnoses and concerns and Identify triggers associated with mental health/substance abuse issues  Therapeutic Interventions: Assess for all discharge needs, 1 to 1 time with Social worker, Explore available resources and support systems, Assess for adequacy in community support network, Educate family and significant other(s) on suicide prevention, Complete Psychosocial Assessment, Interpersonal group therapy.  Evaluation of Outcomes: Progressing   Progress in Treatment: Attending groups: Yes. Participating in groups: Yes. Taking medication as prescribed: Yes. Toleration medication: Yes. Family/Significant other contact made: No, will contact:  pt's father  Patient understands diagnosis: Yes. Discussing patient identified problems/goals with staff: Yes. Medical problems stabilized or resolved: Yes. Denies suicidal/homicidal ideation: No. Passive SI currently. No plan or intent. He currently denies any HI thoughts or intentions.  Issues/concerns per patient self-inventory: No. Other: n/a   New problem(s) identified: No, Describe:  n/a  New Short Term/Long Term Goal(s): medication management for mood stabilization; elimination of SI/HI thoughts; development  of comprehensive mental wellness plan.   Patient Goal: "I want my bad thoughts about my family to go away and to feel less depressed."   Discharge Plan or Barriers: Pt plans to return home with his father. Follow-up at [facility] PCP in [place]. Appt needed prior to d/c. Pt is declining referral to [facility] outpatient.   Reason for Continuation of Hospitalization: Anxiety Depression Medication stabilization Suicidal ideation  Estimated Length of Stay: Tuesday, 10/13/16  Attendees: Patient: 10/09/2016 8:22 AM  Physician: Dr. Jama Flavors MD 10/09/2016 8:22 AM  Nursing: Terrall Laity RN 10/09/2016 8:22 AM  RN Care Manager: Onnie Boer CM 10/09/2016 8:22 AM  Social Worker: Chartered loss adjuster, LCSW 10/09/2016 8:22 AM  Recreational Therapist: x 10/09/2016 8:22 AM  Other: Armandina Stammer NP; Feliz Beam Money NP 10/09/2016 8:22 AM  Other:  10/09/2016 8:22 AM  Other: 10/09/2016 8:22 AM    Scribe for Treatment Team: Ledell Peoples Smart, LCSW 10/09/2016 8:22 AM

## 2016-10-09 NOTE — Progress Notes (Signed)
Pt tells Clinical research associatewriter that he is still very depressed and anxious.  He also says that his back pain is severe, but he has been sitting in the dayroom talking and laughing with peers playing cards.  Pt appears to be relaxed and is moving freely.  He states he still has passive suicidal thoughts and passive HI towards some of his family members, but he is glad that he has his father as a support.  He makes his needs known to staff.  He is medicated per orders for his complaints and requests.  Support and encouragement offered.  Discharge plans are in process.  Safety maintained with q15 minute checks.

## 2016-10-09 NOTE — Progress Notes (Signed)
D: Patient endorses passive SI stating, "but no I will not hurt myself and yes I contract for safety".  He denies any HI or AVH at this time.   Patient has a depressed mood with animated affect.  Pt. Appears very attention seeking, he has made multiple statements about feeling generally weak, denying any previous episodes of same.  Pt. However has been making this statement quite frequently and mostly in the dayroom in front of the other patients.  Pt.'s vital signs have been assessed and found to be within defined limits on each occasion.  Pt. Will then return to any activities occurring.  Pt. Has been outside and to the cafeteria for meals without complaint or incident.    A: Patient given emotional support from RN. Patient encouraged to come to staff with concerns and/or questions. Patient's medication routine continued. Patient's orders and plan of care reviewed.   R: Patient remains appropriate and cooperative. Will continue to monitor patient q15 minutes for safety.
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
## 2016-10-09 NOTE — Progress Notes (Signed)
D   Pt is pleasant on approach and has been cooperative   He is supportive of his peers and said he trys to help people which makes him feel good which in turn helps him  He does endorse anxiety and depression   He contracts for safety A   Verbal support given   Medications administered and effectiveness monitored    Q 15 min checks  R   Pt is safe at present time

## 2016-10-10 MED ORDER — QUETIAPINE FUMARATE 50 MG PO TABS
ORAL_TABLET | ORAL | Status: AC
  Administered 2016-10-10: 50 mg
  Filled 2016-10-10: qty 1

## 2016-10-10 MED ORDER — QUETIAPINE FUMARATE 50 MG PO TABS
50.0000 mg | ORAL_TABLET | Freq: Once | ORAL | Status: AC
  Filled 2016-10-10: qty 1

## 2016-10-10 NOTE — Progress Notes (Signed)
Psychoeducational Group Note  Date:  10/10/2016 Time:  2325  Group Topic/Focus:  Wrap-Up Group:   The focus of this group is to help patients review their daily goal of treatment and discuss progress on daily workbooks.  Participation Level: Did Not Attend  Participation Quality:  Not Applicable  Affect:  Not Applicable  Cognitive:  Not Applicable  Insight:  Not Applicable  Engagement in Group: Not Applicable  Additional Comments: The patient did not attend group this evening since he claimed that he was having issues with his multiple personalities. He explained that his other personality was taking over and that he did not trust himself and felt that he might become aggressive.   Hazle CocaGOODMAN, Amerah Puleo S 10/10/2016, 11:25 PM

## 2016-10-10 NOTE — [facility] Group Notes (Signed)
[facility]  Date/Time:    10/10/2016 10:00-11:00AM  Type of Therapy and Topic:  Group Therapy:  Healthy vs Unhealthy Coping Skills  Participation Level:  Active   Description of Group:  The focus of this group was to determine what unhealthy coping techniques typically are used by group members and what healthy coping techniques would be helpful in coping with various problems. Patients were guided in becoming aware of the differences between healthy and unhealthy coping techniques.  "Benefits" and "Costs" of a number of coping skills were evaluated by the group, including isolation, cutting, drinking/using drugs, exercising, talking things out, and taking out anger on a pillow.    Therapeutic Goals 1. Patients learned that coping is what human beings do all day long to deal with various situations in their lives 2. Patients defined and discussed healthy vs unhealthy coping techniques 3. Patients identified their preferred coping techniques and identified whether these were healthy or unhealthy 4. Patients provided support and ideas to each other  Summary of Patient Progress: During group, patient expressed himself frequently and somewhat insightfully.  There were times he was monopolizing, but others seemed to appreciate his comments so he was not redirected by CSW.     Therapeutic Modalities Cognitive Behavioral Therapy Motivational Interviewing   Ambrose MantleMareida Grossman-Orr, LCSW 10/10/2016, 3:40 PM

## 2016-10-10 NOTE — [facility] Group Notes (Signed)
   Date:  10/10/2016  Time:  1100  Type of Therapy:  Nurse Education  This group is focused on teaching the patients how to idenfy their needs and then how to get them met.   Participation Level:  Active  Participation Quality:  Attentive  Affect:  Appropriate  Cognitive:  Appropriate  Insight:  Good  Engagement in Group:  Engaged  Modes of Intervention:  Education  Summary of Progress/Problems:  Nicholas Rollins 10/10/2016, 3:20 PM

## 2016-10-10 NOTE — Progress Notes (Signed)
[facility] MD Progress Note  10/10/2016 1:50 PM Nicholas Rollins  MRN:  [id number]   Subjective:  Patient states that he is doing ok. He reports feeling much better today and no weakness at all. He admits to some chronic HI, but they are getting less and less each day. He agrees to a medication increase to help with that. He denies any SI/AVH.  Objective: Patient is pleasant and cooperative today. He is seen interacting appropriately and is calm.    Principal Problem: MDD (major depressive disorder), recurrent severe, without psychosis (HCC) Diagnosis:   Patient Active Problem List   Diagnosis Date Noted  . MDD (major depressive disorder), recurrent severe, without psychosis (HCC) [F33.2] 10/06/2016  . Intermittent explosive disorder [F63.81] 09/30/2015  . Moderate persistent asthma [J45.40] 12/20/2014  . Other allergic rhinitis [J30.89] 12/20/2014  . PTSD (post-traumatic stress disorder) [F43.10] 04/22/2012  . ADHD (attention deficit hyperactivity disorder), combined type [F90.2] 04/19/2012  . ODD (oppositional defiant disorder) [F91.3] 04/19/2012   Total Time spent with patient: 15 minutes  Past Psychiatric History: See H&P  Past Medical History:  Past Medical History:  Diagnosis Date  . ADHD (attention deficit hyperactivity disorder)   . Anxiety   . Asthma   . Broken ankle 2013   Left ankle  . Broken wrist 2012   Left wrist  . Cyst of brain 2012   Seen by Dr. Lorenso Courier, cleared for sports, found during work-up for severe headahces.  FOund on either MRI or CT scan.   . Cyst of left orbit    Possible cyst of left eye; observed during routine eye exam fall 2013, "right next to nerve", was supposed to get a follow-up 03/2012.  . Depression   . Fatty liver November 2013   resolved with healthier nutrition and increased physical activity  . Fractured rib 2011  . Headache(784.0)    history of migraines, resolved this with improved overall health  . Obesity   . Vision abnormalities    myopia    Past Surgical History:  Procedure Laterality Date  . ADENOIDECTOMY     21yo   Family History: History reviewed. No pertinent family history. Family Psychiatric  History: See H&P Social History:  History  Alcohol Use No     History  Drug Use No    Social History   Social History  . Marital status: Single    Spouse name: N/A  . Number of children: N/A  . Years of education: N/A   Social History Main Topics  . Smoking status: Never Smoker  . Smokeless tobacco: Never Used  . Alcohol use No  . Drug use: No  . Sexual activity: No   Other Topics Concern  . None   Social History Narrative  . None   Additional Social History:      Sleep: Good  Appetite:  Good  Current Medications: Current Facility-Administered Medications  Medication Dose Route Frequency Provider Last Rate Last Dose  . acetaminophen (TYLENOL) tablet 650 mg  650 mg Oral Q6H PRN Laveda Abbe, NP      . albuterol (PROVENTIL HFA;VENTOLIN HFA) 108 (90 Base) MCG/ACT inhaler 2 puff  2 puff Inhalation Q4H PRN Laveda Abbe, NP      . carbamazepine (TEGRETOL) tablet 200 mg  200 mg Oral BID Cobos, Rockey Situ, MD   200 mg at 10/10/16 7829  . hydrOXYzine (ATARAX/VISTARIL) tablet 25 mg  25 mg Oral Q6H PRN Laveda Abbe, NP   25 mg at  10/07/16 1045  . ibuprofen (ADVIL,MOTRIN) tablet 600 mg  600 mg Oral Q6H PRN Kerry Hough, PA-C   600 mg at 10/10/16 1317  . magnesium hydroxide (MILK OF MAGNESIA) suspension 30 mL  30 mL Oral Daily PRN Laveda Abbe, NP      . propranolol (INDERAL) tablet 10 mg  10 mg Oral TID Laveda Abbe, NP   10 mg at 10/10/16 1317  . QUEtiapine (SEROQUEL) tablet 100 mg  100 mg Oral QHS Money, Travis B, FNP   100 mg at 10/09/16 2126  . QUEtiapine (SEROQUEL) tablet 25 mg  25 mg Oral BID Cobos, Rockey Situ, MD   25 mg at 10/10/16 4132  . sertraline (ZOLOFT) tablet 50 mg  50 mg Oral Daily Cobos, Rockey Situ, MD   50 mg at 10/10/16 4401  . traZODone  (DESYREL) tablet 100 mg  100 mg Oral QHS PRN Laveda Abbe, NP   100 mg at 10/09/16 2244    Lab Results:  No results found for this or any previous visit (from the past 48 hour(s)).  Blood Alcohol level:  Lab Results  Component Value Date   ETH <5 10/06/2016   ETH <5 09/27/2015    Metabolic Disorder Labs: Lab Results  Component Value Date   HGBA1C 5.0 10/08/2016   MPG 96.8 10/08/2016   MPG 114 09/23/2013   Lab Results  Component Value Date   PROLACTIN 20.9 (H) 10/08/2016   PROLACTIN 5.8 09/23/2013   Lab Results  Component Value Date   CHOL 138 10/08/2016   TRIG 130 10/08/2016   HDL 34 (L) 10/08/2016   CHOLHDL 4.1 10/08/2016   VLDL 26 10/08/2016   LDLCALC 78 10/08/2016   LDLCALC 90 09/23/2013    Physical Findings: AIMS: Facial and Oral Movements Muscles of Facial Expression: None, normal Lips and Perioral Area: None, normal Jaw: None, normal Tongue: None, normal,Extremity Movements Upper (arms, wrists, hands, fingers): None, normal Lower (legs, knees, ankles, toes): None, normal, Trunk Movements Neck, shoulders, hips: None, normal, Overall Severity Severity of abnormal movements (highest score from questions above): None, normal Incapacitation due to abnormal movements: None, normal Patient's awareness of abnormal movements (rate only patient's report): No Awareness, Dental Status Current problems with teeth and/or dentures?: No Does patient usually wear dentures?: No  CIWA:    COWS:     Musculoskeletal: Strength & Muscle Tone: within normal limits Gait & Station: normal Patient leans: Right  Psychiatric Specialty Exam: Physical Exam  Nursing note and vitals reviewed. Constitutional: He is oriented to person, place, and time. He appears well-developed and well-nourished.  Cardiovascular: Normal rate.   Respiratory: Effort normal.  Musculoskeletal: Normal range of motion.  Neurological: He is alert and oriented to person, place, and time.  Skin:  Skin is warm.    Review of Systems  Constitutional: Negative.   HENT: Negative.   Eyes: Negative.   Respiratory: Negative.   Cardiovascular: Negative.   Gastrointestinal: Negative.   Genitourinary: Negative.   Musculoskeletal: Negative.   Skin: Negative.   Neurological: Negative.   Endo/Heme/Allergies: Negative.     Blood pressure 132/73, pulse 77, temperature 97.6 F (36.4 C), resp. rate 18, height 6' (1.829 m), weight (!) 147.9 kg (326 lb).Body mass index is 44.21 kg/m.  General Appearance: Casual  Eye Contact:  Minimal  Speech:  Clear and Coherent  Volume:  Normal  Mood:  Depressed but improving  Affect:  Flat  Thought Process:  Disorganized and Descriptions of Associations: Circumstantial  Orientation:  Full (Time, Place, and Person)  Thought Content:  Delusions and Rumination  Suicidal Thoughts:  No  Homicidal Thoughts:  Yes.  without intent/plan  Memory:  Immediate;   Fair Recent;   Fair  Judgement:  Fair  Insight:  Fair  Psychomotor Activity:  Normal  Concentration:  Concentration: Fair and Attention Span: Fair  Recall:  [employer]ervFair  Fund of Knowledge:  Fair  Language:  Good  Akathisia:  No  Handed:  Right  AIMS (if indicated):     Assets:  Housing Social Support Talents/Skills  ADL's:  Intact  Cognition:  WNL  Sleep:  Number of Hours: 6.75     Treatment Plan Summary: Daily contact with patient to assess and evaluate symptoms and progress in treatment, Medication management and Plan is to:  -Continue Seroquel 100 mg PO QHS and 25 mg PO Daily for mood stability -Continue Zoloft 50 mg PO Daily for mood stability -Continue Trazodone 100 mg PO QHS PRN for insomnia -Continue Tegretol 200 mg PO BID for mood stability -Continue Vistaril 25 mg PO Daily.  -Encourage group therapy participation  Maryfrances Bunnellravis B Money, FNP 10/10/2016, 1:50 PM
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
## 2016-10-11 DIAGNOSIS — R450 Nervousness: Secondary | ICD-10-CM

## 2016-10-11 DIAGNOSIS — R443 Hallucinations, unspecified: Secondary | ICD-10-CM

## 2016-10-11 DIAGNOSIS — F419 Anxiety disorder, unspecified: Secondary | ICD-10-CM

## 2016-10-11 MED ORDER — ALUM & MAG HYDROXIDE-SIMETH 200-200-20 MG/5ML PO SUSP
30.0000 mL | Freq: Four times a day (QID) | ORAL | Status: DC | PRN
  Administered 2016-10-11: 30 mL via ORAL

## 2016-10-11 NOTE — Progress Notes (Signed)
[facility] Group Notes:  (Nursing/MHT/Case Management/Adjunct)  Date:  10/11/2016  Time:  2045  Type of Therapy:  wrap up group  Participation Level:  Active  Participation Quality:  Sharing and Supportive  Affect:  Appropriate  Cognitive:  Appropriate  Insight:  Improving  Engagement in Group:  Distracting and Engaged  Modes of Intervention:  Clarification, Education and Support  Summary of Progress/Problems: Pt shared that he felt very well today and attributes the feeling to new medication he is on. Pt shared that he has had no agitation or anxiety today which he is used to experiencing several times a day. Pt is looking forward to discharge either Monday or Tuesday. Pt shared that an emotional support for him is having someone to trust and a physical support, his cat.   Marcille BuffyMcNeil, Silva Aamodt S 10/11/2016, 10:29 PM

## 2016-10-11 NOTE — [facility] Group Notes (Signed)
[facility] LCSW Group Therapy Note  Date/Time:    10/11/2016   10:00 - 11:00 AM  Type of Therapy and Topic:  Group Therapy:  Healthy Self Image and Positive Change  Participation Level:  Active   Description of Group:  In this group, patients compared and contrasted their current "I am...." statements to the visions they identified as desirable for their lives.  Patients discussed their tendency toward cognitive distortions, and how they can go about making positive changes in their cognitions that will positively impact their behaviors.  Many expressions of similarities and mutual support were provided among group members.  Facilitator played a motivational 3-minute speech and a discussion was held regarding reactions.  Patients were left with the task of thinking about what "I am...." statements they can start using in their lives immediately.  Therapeutic Goals: 1. Patient will state their current self-perception as expressed in an "I Am" statement 2. Patient will contrast this with their desired vision for their lives 3. Patient will discuss cognitive distortions and how these affect their ongoing "I Am" thoughts 4. Patient will verbalize statements that challenge their cognitive distortions  Summary of Patient Progress:  The patient expressed initially "I am paranoid", then by the end of group was able to state "I am stronger" and explain why he was able to make this change.  He was resistant to the idea and other group members shared their perspectives about how he could potentially challenge even those distorted thoughts that he was continuing to share.   Therapeutic Modalities Cognitive Behavioral Therapy Motivational Interviewing  Ambrose MantleMareida Grossman-Orr, LCSW 10/11/2016 2:06 PM
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
## 2016-10-11 NOTE — Progress Notes (Signed)
Patient ID: Nicholas Rollins, male   DOB: 1995/06/12, 21 y.o.   MRN: [id number]  DAR: Pt. Denies SI/HI and A/V Hallucinations. He reports that his sleep last night was good, appetite is good, energy level is high, and concentration is good. He rates depression, hopelessness, and anxiety 0/10 today. Patient does report some abdominal pain this morning however refuses intervention. This evening he states he feels like he has "gas" and some indigestion. PRN Maalox was administered and patient received relief. Support and encouragement provided to the patient. Scheduled medications administered to patient per physician's orders. Patient is minimal with Clinical research associatewriter but cooperative. He is seen in the milieu and is interacting with his peers. Q15 minute checks are maintained for safety.

## 2016-10-11 NOTE — [facility] Group Notes (Signed)
   Date:  10/11/2016  Time:  1100  Type of Therapy:  Nurse Education  /  Anger as a 2nd Emotion : The group is focused on teaching patients how to identify their primary feeling that preceds their anger and then teach them how to develop healthy coping skills needed to deal with their anger.  Participation Level:  Active  Participation Quality:  Attentive  Affect:  Appropriate  Cognitive:  Alert  Insight:  Good  Engagement in Group:  Engaged  Modes of Intervention:  Education  Summary of Progress/Problems:  Rich BraveDuke, Chares Slaymaker Lynn 10/11/2016, 6:49 PM

## 2016-10-12 LAB — CARBAMAZEPINE LEVEL, TOTAL: Carbamazepine Lvl: 6.4 ug/mL (ref 4.0–12.0)

## 2016-10-12 NOTE — Progress Notes (Signed)
D: Pt presents with a flat affect. Pt denies SI/HI. Pt denies depression and anxiety today. Pt reports fair sleep at bedtime. Pt stated that he's ready to discharge today and that he's unable to discharge on tues or wed because his father is unable to pick him up on those days. Pt expressed that he have a f/u appt that was scheduled for Thursday that he would like to attend so that is another reason why he would like to d/c today.  A: Medications reviewed pt. Medications administered as ordered per MD. Verbal support provided. Pt encouraged to attend groups.  15 minute checks performed for safety. R: Pt compliant with tx.

## 2016-10-12 NOTE — Progress Notes (Signed)
[facility] MD Progress Note  10/12/2016 2:43 PM HAYGEN ZEBROWSKI  MRN:  [id number] Subjective: patient reports he is feeling " a lot better" and at this time he is future oriented, hoping for discharge soon. States he notices that issues that would have made him angry and irritable in the past no longer bother him. For example, states that a peer had made some rude comment, which in the past would have upset him, but which he was now " able to ignore and not care about". States " I am feeling happier, less paranoid, I am making people laugh in the day room". Denies medication side effects.  Objective:  I have discussed case with treatment team and have met with patient. Presents alert, attentive, calm, pleasant on approach, noted to be interacting appropriately, laughing with peers in day room. As above, reports he is feeling much better than prior to admission, less irritable, denies any lingering homicidal ideations, and specifically denies any further homicidal ideations towards his mother or great grandparents . Denies medication side effects. As he improves he is becoming more focused on discharge planning . No dissociative symptoms are noted at this time.   Principal Problem: MDD (major depressive disorder), recurrent severe, without psychosis ([place]) Diagnosis:   Patient Active Problem List   Diagnosis Date Noted  . MDD (major depressive disorder), recurrent severe, without psychosis ([place]) [F33.2] 10/06/2016  . Intermittent explosive disorder [F63.81] 09/30/2015  . Moderate persistent asthma [J45.40] 12/20/2014  . Other allergic rhinitis [J30.89] 12/20/2014  . PTSD (post-traumatic stress disorder) [F43.10] 04/22/2012  . ADHD (attention deficit hyperactivity disorder), combined type [F90.2] 04/19/2012  . ODD (oppositional defiant disorder) [F91.3] 04/19/2012   Total Time spent with patient: 20 minutes  Past Psychiatric History: Reviewed.  Past Medical History:  Past Medical History:   Diagnosis Date  . ADHD (attention deficit hyperactivity disorder)   . Anxiety   . Asthma   . Broken ankle 2013   Left ankle  . Broken wrist 2012   Left wrist  . Cyst of brain 2012   Seen by Dr. Prince Rome, cleared for sports, found during work-up for severe headahces.  FOund on either MRI or CT scan.   . Cyst of left orbit    Possible cyst of left eye; observed during routine eye exam fall 2013, "right next to nerve", was supposed to get a follow-up 03/2012.  . Depression   . Fatty liver November 2013   resolved with healthier nutrition and increased physical activity  . Fractured rib 2011  . Headache(784.0)    history of migraines, resolved this with improved overall health  . Obesity   . Vision abnormalities    myopia    Past Surgical History:  Procedure Laterality Date  . ADENOIDECTOMY     21yo   Family History: History reviewed. No pertinent family history. Family Psychiatric  History: reviewed. Social History:  History  Alcohol Use No     History  Drug Use No    Social History   Social History  . Marital status: Single    Spouse name: N/A  . Number of children: N/A  . Years of education: N/A   Social History Main Topics  . Smoking status: Never Smoker  . Smokeless tobacco: Never Used  . Alcohol use No  . Drug use: No  . Sexual activity: No   Other Topics Concern  . None   Social History Narrative  . None   Additional Social History:   Sleep: improved  Appetite:  Good  Current Medications: Current Facility-Administered Medications  Medication Dose Route Frequency Provider Last Rate Last Dose  . acetaminophen (TYLENOL) tablet 650 mg  650 mg Oral Q6H PRN Ethelene Hal, NP      . albuterol (PROVENTIL HFA;VENTOLIN HFA) 108 (90 Base) MCG/ACT inhaler 2 puff  2 puff Inhalation Q4H PRN Ethelene Hal, NP      . alum & mag hydroxide-simeth (MAALOX/MYLANTA) 200-200-20 MG/5ML suspension 30 mL  30 mL Oral Q6H PRN Ethelene Hal, NP   30  mL at 10/11/16 1712  . carbamazepine (TEGRETOL) tablet 200 mg  200 mg Oral BID Cobos, Myer Peer, MD   200 mg at 10/12/16 0826  . hydrOXYzine (ATARAX/VISTARIL) tablet 25 mg  25 mg Oral Q6H PRN Ethelene Hal, NP   25 mg at 10/11/16 2216  . ibuprofen (ADVIL,MOTRIN) tablet 600 mg  600 mg Oral Q6H PRN Laverle Hobby, PA-C   600 mg at 10/10/16 1317  . magnesium hydroxide (MILK OF MAGNESIA) suspension 30 mL  30 mL Oral Daily PRN Ethelene Hal, NP      . propranolol (INDERAL) tablet 10 mg  10 mg Oral TID Ethelene Hal, NP   10 mg at 10/12/16 1256  . QUEtiapine (SEROQUEL) tablet 100 mg  100 mg Oral QHS Money, Travis B, FNP   100 mg at 10/11/16 2216  . QUEtiapine (SEROQUEL) tablet 25 mg  25 mg Oral BID Cobos, Myer Peer, MD   25 mg at 10/12/16 0826  . sertraline (ZOLOFT) tablet 50 mg  50 mg Oral Daily Cobos, Myer Peer, MD   50 mg at 10/12/16 0826  . traZODone (DESYREL) tablet 100 mg  100 mg Oral QHS PRN Ethelene Hal, NP   100 mg at 10/11/16 2216    Lab Results: No results found for this or any previous visit (from the past 48 hour(s)).  Blood Alcohol level:  Lab Results  Component Value Date   ETH <5 10/06/2016   ETH <5 85/03/7739    Metabolic Disorder Labs: Lab Results  Component Value Date   HGBA1C 5.0 10/08/2016   MPG 96.8 10/08/2016   MPG 114 09/23/2013   Lab Results  Component Value Date   PROLACTIN 20.9 (H) 10/08/2016   PROLACTIN 5.8 09/23/2013   Lab Results  Component Value Date   CHOL 138 10/08/2016   TRIG 130 10/08/2016   HDL 34 (L) 10/08/2016   CHOLHDL 4.1 10/08/2016   VLDL 26 10/08/2016   LDLCALC 78 10/08/2016   LDLCALC 90 09/23/2013    Physical Findings: AIMS: Facial and Oral Movements Muscles of Facial Expression: None, normal Lips and Perioral Area: None, normal Jaw: None, normal Tongue: None, normal,Extremity Movements Upper (arms, wrists, hands, fingers): None, normal Lower (legs, knees, ankles, toes): None, normal, Trunk  Movements Neck, shoulders, hips: None, normal, Overall Severity Severity of abnormal movements (highest score from questions above): None, normal Incapacitation due to abnormal movements: None, normal Patient's awareness of abnormal movements (rate only patient's report): No Awareness, Dental Status Current problems with teeth and/or dentures?: No Does patient usually wear dentures?: No  CIWA:    COWS:     Musculoskeletal: Strength & Muscle Tone: within normal limits Gait & Station: normal Patient leans: N/A  Psychiatric Specialty Exam: Physical Exam  Review of Systems  Constitutional: Negative.   HENT: Negative.   Respiratory: Negative.   Musculoskeletal: Negative.   Skin: Negative.   Neurological: Negative.   Psychiatric/Behavioral: Positive for depression and  hallucinations. The patient is nervous/anxious.   no headache, no chest pain, no shortness of breath, no rash   Blood pressure (!) 154/80, pulse 100, temperature 97.9 F (36.6 C), temperature source Oral, resp. rate 16, height 6' (1.829 m), weight (!) 147.9 kg (326 lb).Body mass index is 44.21 kg/m.  General Appearance: Well Groomed  Eye Contact:  Good  Speech:  Normal Rate  Volume:  Normal  Mood:  mood improved, today presents euthymic   Affect:  fuller in range, brighter   Thought Process:  Linear and Descriptions of Associations: Intact  Orientation:  Full (Time, Place, and Person)  Thought Content:  denies hallucinations, no delusions expressed, not internally preoccupied   Suicidal Thoughts:  No denies suicidal or self injurious ideations at this time, denies homicidal ideations, and also denies homicidal ideations towards mother or great grandparents   Homicidal Thoughts:  No  Memory:  Recent and remote grossly intact    Judgement:  Other:  improving   Insight:  improving   Psychomotor Activity:  Normal  Concentration:  Concentration: Good and Attention Span: Good  Recall:  Good  Fund of Knowledge:  Good   Language:  Good  Akathisia:  No  Handed:  Right  AIMS (if indicated):     Assets:  Communication Skills Desire for Improvement Housing  ADL's:  Intact  Cognition:  WNL  Sleep:  Number of Hours: 5.75   Assessment - patient presenting with improvement compared to admission, at this time presents euthymic, denies suicidal or homicidal ideations, denies any ongoing homicidal ideations towards his mother or other family members, presents less anxious.  Tolerating medications well .   Treatment Plan Summary: Treatment Plan reviewed as below today 9/10 Encourage ongoing group and milieu participation to work on coping skills and symptom reduction Treatment team working on disposition planning options  Daily contact with patient to assess and evaluate symptoms and progress in treatment and Medication management  Continue Tegretol 200 mg BID for mood instability, explosiveness  Continue Zoloft 50 mgrs QDAY for depression, anxiety Continue Seroquel 25 mgrs BID and 100 mgrs QHS for mood disorder Continue Vistaril 25 mg Q6 hours PRN for anxiety Continue Trazodone 100 mgrs QHS PRN for insomnia    Jenne Campus, MD 10/12/2016, 2:43 PM   Patient ID: Melanie Crazier, male   DOB: [birth year], 21 y.o.   MRN: [id number]

## 2016-10-12 NOTE — Plan of Care (Signed)
Problem: Safety: Goal: Periods of time without injury will increase Outcome: Progressing Pt safe on the unit at this time   

## 2016-10-12 NOTE — Plan of Care (Signed)
Problem: Medication: Goal: Compliance with prescribed medication regimen will improve Outcome: Progressing Pt compliant with taking meds. No side effects to  meds verbalized by pt.    

## 2016-10-12 NOTE — Progress Notes (Signed)
D: Pt denies SI/HI/AVH. Pt is pleasant and cooperative. Pt stated he was doing better, pt said he just wanted to sleep so he could go home tomorrow.   A: Pt was offered support and encouragement. Pt was given scheduled medications. Pt was encourage to attend groups. Q 15 minute checks were done for safety.   R:Pt attends groups and interacts well with peers and staff. Pt is taking medication. Pt has no complaints at this time .Pt receptive to treatment and safety maintained on unit.

## 2016-10-12 NOTE — [facility] Group Notes (Signed)
LCSW Group Therapy Note   10/12/2016 1:15pm   Type of Therapy and Topic:  Group Therapy:  Overcoming Obstacles   Participation Level:  Active   Description of Group:    In this group patients will be encouraged to explore what they see as obstacles to their own wellness and recovery. They will be guided to discuss their thoughts, feelings, and behaviors related to these obstacles. The group will process together ways to cope with barriers, with attention given to specific choices patients can make. Each patient will be challenged to identify changes they are motivated to make in order to overcome their obstacles. This group will be process-oriented, with patients participating in exploration of their own experiences as well as giving and receiving support and challenge from other group members.   Therapeutic Goals: 1. Patient will identify personal and current obstacles as they relate to admission. 2. Patient will identify barriers that currently interfere with their wellness or overcoming obstacles.  3. Patient will identify feelings, thought process and behaviors related to these barriers. 4. Patient will identify two changes they are willing to make to overcome these obstacles:      Summary of Patient Progress  Nicholas Rollins was attentive and engaged during today's processing group. He shared that "dealing with my MPD and mood problems" is his biggest obstacle. "I had a great day yesterday. I think the medication is really helping me manage my anger and paranoia." Nicholas Rollins continues to focus on having "multiple personalities" and demonstrates limited insight into his illness with some progress in the group setting.     Therapeutic Modalities:   Cognitive Behavioral Therapy Solution Focused Therapy Motivational Interviewing Relapse Prevention Therapy  Ledell PeoplesHeather N Smart, LCSW 10/12/2016 2:09 PM

## 2016-10-12 NOTE — Progress Notes (Signed)
D   Pt is pleasant and appropriate   He said he is ready to be discharged and feels like he is strong enough to use his coping skills to distract himself from his bad thoughts  A   Verbal support given   Medications administered and effectivness monitored   Q 15 min checks R   Pt is safe at present time

## 2016-10-12 NOTE — Progress Notes (Signed)
Recreation Therapy Notes  Date: 10/12/16 Time: 0930 Location: [address] Dayroom  Group Topic: Stress Management  Goal Area(s) Addresses:  Patient will verbalize importance of using healthy stress management.  Patient will identify positive emotions associated with healthy stress management.   Intervention: Stress Management  Activity :  Meditation.   LRT introduced the stress management technique of meditation.  LRT played a meditation from the Calm app that focused on scanning the body.  Patients were to focus on and make not of whatever sensations they were feeling.  Education:  Stress Management, Discharge Planning.   Education Outcome: Acknowledges edcuation/In group clarification offered/Needs additional education  Clinical Observations/Feedback: Pt did not attend group.   Caroll RancherMarjette Otie Headlee, LRT/CTRS         Caroll RancherLindsay, Tylasia Fletchall A 10/12/2016 12:07 PM

## 2016-10-13 LAB — PROLACTIN: PROLACTIN: 22.7 ng/mL — AB (ref 4.0–15.2)

## 2016-10-13 MED ORDER — CARBAMAZEPINE 200 MG PO TABS: 200.0000 mg | ORAL_TABLET | Freq: Two times a day (BID) | ORAL | 0 refills | Status: DC

## 2016-10-13 MED ORDER — QUETIAPINE FUMARATE 100 MG PO TABS: 100.0000 mg | ORAL_TABLET | Freq: Every day | ORAL | 0 refills | Status: DC

## 2016-10-13 MED ORDER — TRAZODONE HCL 100 MG PO TABS: 100.0000 mg | ORAL_TABLET | Freq: Every evening | ORAL | 0 refills | Status: DC | PRN

## 2016-10-13 MED ORDER — PROPRANOLOL HCL 10 MG PO TABS: 10.0000 mg | ORAL_TABLET | Freq: Three times a day (TID) | ORAL | 0 refills | Status: DC

## 2016-10-13 MED ORDER — QUETIAPINE FUMARATE 25 MG PO TABS: 25.0000 mg | ORAL_TABLET | Freq: Two times a day (BID) | ORAL | 0 refills | Status: DC

## 2016-10-13 MED ORDER — SERTRALINE HCL 50 MG PO TABS: 50.0000 mg | ORAL_TABLET | Freq: Every day | ORAL | 0 refills | Status: DC

## 2016-10-13 MED ORDER — HYDROXYZINE HCL 25 MG PO TABS
25.0000 mg | ORAL_TABLET | Freq: Four times a day (QID) | ORAL | 0 refills | Status: DC | PRN
Start: 2016-10-13 — End: 2022-07-31

## 2016-10-13 NOTE — [facility] Suicide Risk Assessment (Signed)
[facility]   Principal Problem: MDD (major depressive disorder), recurrent severe, without psychosis ([facility]) Discharge Diagnoses:  Patient Active Problem List   Diagnosis Date Noted  . MDD (major depressive disorder), recurrent severe, without psychosis ([facility]) [F33.2] 10/06/2016  . Intermittent explosive disorder [F63.81] 09/30/2015  . Moderate persistent asthma [J45.40] 12/20/2014  . Other allergic rhinitis [J30.89] 12/20/2014  . PTSD (post-traumatic stress disorder) [F43.10] 04/22/2012  . ADHD (attention deficit hyperactivity disorder), combined type [F90.2] 04/19/2012  . ODD (oppositional defiant disorder) [F91.3] 04/19/2012    Total Time spent with patient: 30 minutes  Musculoskeletal: Strength & Muscle Tone: within normal limits Gait & Station: normal Patient leans: N/A  Psychiatric Specialty Exam: ROS no headache, no chest pain, no vomiting . History of L shoulder pain  Blood pressure (!) 145/97, pulse 76, temperature (!) 97.4 F (36.3 C), temperature source Oral, resp. rate 20, height 6' (1.829 m), weight (!) 147.9 kg (326 lb).Body mass index is 44.21 kg/m.  General Appearance: improved grooming   Eye Contact::  Good  Speech:  Normal Rate409  Volume:  Normal  Mood:  improved mood , denies depression  Affect:  appropriate, reactive   Thought Process:  Linear and Descriptions of Associations: Intact  Orientation:  Full (Time, Place, and Person)  Thought Content:  no hallucinations, no delusions, not internally preoccupied   Suicidal Thoughts:  No denies any suicidal ideations, denies homicidal or violent ideations and also specifically denies any homicidal ideations towards mother, grandparents , or any family members   Homicidal Thoughts:  No  Memory:  recent and remote grossly intact   Judgement:  Other:  improving  Insight:  improving   Psychomotor Activity:  Normal  Concentration:  Good  Recall:  Good  Fund of Knowledge:Good  Language:  Good  Akathisia:  Negative  Handed:  Right  AIMS (if indicated):   no abnormal or involuntary movements noted or reported   Assets:  Desire for Improvement Resilience  Sleep:  Number of Hours: 6  Cognition: WNL  ADL's:  Intact   Mental Status Per Nursing Assessment::   On Admission:  Suicidal ideation indicated by patient, Self-harm thoughts, Self-harm behaviors  Demographic Factors:  21 year old single male, lives with father, unemployed   Loss Factors: Unemployment   Historical Factors: History of prior psychiatric admission, history of prior diagnosis of PTSD, ADHD, ODD . Reports history of intermittent explosiveness   Risk Reduction Factors:   Living with another person, especially a relative and Positive coping skills or problem solving skills  Continued Clinical Symptoms:  At this time patient is alert, attentive, well related, calm, pleasant, mood is improved and currently euthymic, affect is appropriate, reactive, no thought disorder, no suicidal or self injurious ideations , no homicidal or violent ideations,denies any current homicidal or violent ideations towards mother or grandparents. He is denying any hallucinations, no delusions are expressed. Currently future oriented, and looking forward to getting GED, driver's license . Behavior on unit calm and in good control, interactive with peers, pleasant on approach. Denies medication side effects. Carbamazepine serum level therapeutic at 6.4  Side effects discussed, including risk of severe rash, agranulocytosis on Carbamazepine, and sedation, weight gain, metabolic disturbances, and movement disorders on Seroquel . Marland Kitchen.   Cognitive Features That Contribute To Risk:  Closed-mindedness and Loss of executive function    Suicide Risk:  Mild:  Suicidal ideation of limited frequency, intensity, duration, and specificity.  There are no identifiable plans, no associated intent, mild  dysphoria and related symptoms, good  self-control (both objective and subjective assessment), few other risk factors, and identifiable protective factors, including available and accessible social support.  Follow-up Information    [facility] Follow up on 10/15/2016.   Why:  Hospital follow-up at 12:30pm with Dr. Watt Climes on Thursday 9/13. Please bring ID and medicaid card if you have it. Thank you.  Contact information: [address], [place] [address] Phone: [phone] Fax: [phone]          Plan Of Care/Follow-up recommendations:  Activity:  as tolerated  Diet:  Regular Tests:  NA Other:  See below Patient is expressing readiness for discharge, there are no current grounds for involuntary commitment Plans to return home - lives with father. Plans to follow up as above  Craige Cotta, MD 10/13/2016, 8:41 AM

## 2016-10-13 NOTE — Progress Notes (Signed)
Adult Psychoeducational Group Note  Date:  10/13/2016 Time:  4:46 AM  Group Topic/Focus:  Wrap-Up Group:   The focus of this group is to help patients review their daily goal of treatment and discuss progress on daily workbooks.  Participation Level:  Active  Participation Quality:  Appropriate  Affect:  Appropriate  Cognitive:  Alert and Appropriate  Insight: Appropriate, Good and Improving  Engagement in Group:  Engaged  Modes of Intervention:  Discussion  Additional Comments:  Pt stated his goal for today was to talk with his doctor about his discharge plan. Pt stated he felt good when he achieved his goal today. Pt rated her over all day a 10. Pt stated his goal for tomorrow was to attend all groups.  Felipa FurnaceChristopher  Brilynn Biasi 10/13/2016, 4:46 AM

## 2016-10-13 NOTE — Progress Notes (Signed)
Discharge note:  Patient discharged home per MD order.  Patient received all personal belongings from room and unit.  Reviewed AVS/transition record with patient and he indicated understanding.  Patient received prescriptions and samples of his medications.  [facility]  He refused any psychiatric follow up.  Patient denies any thoughts of self harm.  Patient left ambulatory with his grandparents.
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
## 2016-10-13 NOTE — Progress Notes (Signed)
  [facility] Adult Case Management Discharge Plan :  Will you be returning to the same living situation after discharge:  Yes,  Pt returning home with father At discharge, do you have transportation home?: Yes,  father to pick up Do you have the ability to pay for your medications: Yes,  Pt provided with samples and prescriptions  Release of information consent forms completed and in the chart;  Patient's signature needed at discharge.  Patient to Follow up at: Follow-up Information    [facility] [place] Follow up on 10/15/2016.   Why:  Hospital follow-up at 12:30pm with Dr. Watt ClimesEscajeda on Thursday 9/13. Please bring ID and medicaid card if you have it. Thank you.  Contact information: [address] [place], [place] [address] Phone: [phone] Fax: [phone]       Pt declines referral for therapy or a psychiatrist Follow up.           Next level of care provider has access to [facility] Link:no  Safety Planning and Suicide Prevention discussed: Yes,  with father; see SPE note  Have you used any form of tobacco in the last 30 days? (Cigarettes, Smokeless Tobacco, Cigars, and/or Pipes): No  Has patient been referred to the Quitline?: N/A patient is not a smoker  Patient has been referred for addiction treatment: N/A  Verdene LennertLauren C Tyjai Charbonnet, LCSW 10/13/2016, 10:28 AM

## 2016-10-14 LAB — CARBAMAZEPINE, FREE AND TOTAL
CARBAMAZEPINE FREE: 1.8 ug/mL (ref 0.6–4.2)
Carbamazepine, Total: 6 ug/mL (ref 4.0–12.0)
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
## 2022-07-30 ENCOUNTER — Encounter (HOSPITAL_COMMUNITY): Payer: Self-pay | Admitting: Behavioral Health

## 2022-07-30 ENCOUNTER — Ambulatory Visit (HOSPITAL_COMMUNITY)
Admission: EM | Admit: 2022-07-30 | Discharge: 2022-07-31 | Disposition: A | Discharge status: Home / Self Care | Payer: Medicaid Other | Attending: Behavioral Health | Admitting: Behavioral Health

## 2022-07-30 DIAGNOSIS — R45851 Suicidal ideations: Secondary | ICD-10-CM

## 2022-07-30 DIAGNOSIS — R454 Irritability and anger: Secondary | ICD-10-CM | POA: Diagnosis present

## 2022-07-30 DIAGNOSIS — F332 Major depressive disorder, recurrent severe without psychotic features: Secondary | ICD-10-CM | POA: Diagnosis present

## 2022-07-30 DIAGNOSIS — Z818 Family history of other mental and behavioral disorders: Secondary | ICD-10-CM | POA: Insufficient documentation

## 2022-07-30 LAB — LIPID PANEL
Cholesterol: 153 mg/dL (ref 0–200)
HDL: 54 mg/dL (ref >40)
LDL Cholesterol: 83 mg/dL (ref 0–99)
Total CHOL/HDL Ratio: 2.8 RATIO
Triglycerides: 78 mg/dL (ref <150)
VLDL: 16 mg/dL (ref 0–40)

## 2022-07-30 LAB — COMPREHENSIVE METABOLIC PANEL
ALT: 16 U/L (ref 0–44)
AST: 17 U/L (ref 15–41)
Albumin: 3.6 g/dL (ref 3.5–5.0)
Alkaline Phosphatase: 27 U/L — ABNORMAL LOW (ref 38–126)
Anion gap: 7 (ref 5–15)
BUN: 7 mg/dL (ref 6–20)
CO2: 23 mmol/L (ref 22–32)
Calcium: 8.4 mg/dL — ABNORMAL LOW (ref 8.9–10.3)
Chloride: 108 mmol/L (ref 98–111)
Creatinine, Ser: 1 mg/dL (ref 0.61–1.24)
GFR, Estimated: >60 mL/min (ref >60)
Glucose, Bld: 88 mg/dL (ref 70–99)
Potassium: 3.7 mmol/L (ref 3.5–5.1)
Sodium: 138 mmol/L (ref 135–145)
Total Bilirubin: 1.6 mg/dL — ABNORMAL HIGH (ref 0.3–1.2)
Total Protein: 5.2 g/dL — ABNORMAL LOW (ref 6.5–8.1)

## 2022-07-30 LAB — POCT URINE DRUG SCREEN - MANUAL ENTRY (I-SCREEN)
POC Amphetamine UR: NOT DETECTED
POC Buprenorphine (BUP): NOT DETECTED
POC Cocaine UR: NOT DETECTED
POC Marijuana UR: POSITIVE — AB
POC Methadone UR: NOT DETECTED
POC Methamphetamine UR: NOT DETECTED
POC Morphine: NOT DETECTED
POC Oxazepam (BZO): NOT DETECTED
POC Oxycodone UR: NOT DETECTED
POC Secobarbital (BAR): NOT DETECTED

## 2022-07-30 LAB — CBC WITH DIFFERENTIAL/PLATELET
Abs Immature Granulocytes: 0.02 10*3/uL (ref 0.00–0.07)
Basophils Absolute: 0 10*3/uL (ref 0.0–0.1)
Basophils Relative: 1 %
Eosinophils Absolute: 0 10*3/uL (ref 0.0–0.5)
Eosinophils Relative: 1 %
HCT: 43.3 % (ref 39.0–52.0)
Hemoglobin: 14.9 g/dL (ref 13.0–17.0)
Immature Granulocytes: 0 %
Lymphocytes Relative: 24 %
Lymphs Abs: 1.5 10*3/uL (ref 0.7–4.0)
MCH: 30.3 pg (ref 26.0–34.0)
MCHC: 34.4 g/dL (ref 30.0–36.0)
MCV: 88 fL (ref 80.0–100.0)
Monocytes Absolute: 0.3 10*3/uL (ref 0.1–1.0)
Monocytes Relative: 5 %
Neutro Abs: 4.4 10*3/uL (ref 1.7–7.7)
Neutrophils Relative %: 69 %
Platelets: 119 10*3/uL — ABNORMAL LOW (ref 150–400)
RBC: 4.92 MIL/uL (ref 4.22–5.81)
RDW: 13.1 % (ref 11.5–15.5)
WBC: 6.3 10*3/uL (ref 4.0–10.5)
nRBC: 0 % (ref 0.0–0.2)

## 2022-07-30 LAB — MAGNESIUM: Magnesium: 1.8 mg/dL (ref 1.7–2.4)

## 2022-07-30 LAB — ETHANOL: Alcohol, Ethyl (B): <10 mg/dL (ref <10)

## 2022-07-30 LAB — TSH: TSH: 1.689 u[IU]/mL (ref 0.350–4.500)

## 2022-07-30 MED ORDER — HYDROXYZINE HCL 25 MG PO TABS
25.0000 mg | ORAL_TABLET | Freq: Three times a day (TID) | ORAL | Status: DC | PRN
  Administered 2022-07-30: 25 mg via ORAL
  Filled 2022-07-30: qty 1

## 2022-07-30 MED ORDER — ALUM & MAG HYDROXIDE-SIMETH 200-200-20 MG/5ML PO SUSP: 30.0000 mL | ORAL | Status: DC | PRN

## 2022-07-30 MED ORDER — TRAZODONE HCL 50 MG PO TABS
50.0000 mg | ORAL_TABLET | Freq: Every evening | ORAL | Status: DC | PRN
  Filled 2022-07-30: qty 1

## 2022-07-30 MED ORDER — ACETAMINOPHEN 325 MG PO TABS: 650.0000 mg | ORAL_TABLET | Freq: Four times a day (QID) | ORAL | Status: DC | PRN

## 2022-07-30 MED ORDER — MAGNESIUM HYDROXIDE 400 MG/5ML PO SUSP: 30.0000 mL | Freq: Every day | ORAL | Status: DC | PRN

## 2022-07-30 MED ORDER — SERTRALINE HCL 50 MG PO TABS
50.0000 mg | ORAL_TABLET | Freq: Every day | ORAL | Status: DC
  Administered 2022-07-31: 50 mg via ORAL
  Filled 2022-07-30: qty 1

## 2022-07-30 NOTE — ED Notes (Signed)
Patient admitted to Obs unit. Patient A&Ox4. Patient endorsing passive SI and auditory hallucinations. Patient states "the thoughts are always there." Patient verbalizes no plan or intent. Denies HI and VH. Denies any pain or discomfort. Patient noted to have old cutting marks that have scarred over on arms bilaterally. Patient stated cuts happened "back in 2017." Patient denies any recent cutting. Patient provided with meal and oriented to unit. Patient remains pleasant and cooperative. No s/s of current distress.

## 2022-07-30 NOTE — ED Notes (Signed)
Pt awake calm and cooperative no pain or distress will continue to monitor for safety
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
## 2022-07-30 NOTE — ED Provider Notes (Signed)
[facility] Continuous Assessment Admission H&P  Date: 07/30/22 Patient Name: Nicholas Rollins MRN: [id number] Chief Complaint: "Depression and my anger"  Diagnoses:  Final diagnoses:  MDD (major depressive disorder), recurrent severe, without psychosis (HCC)  Suicidal ideation  Anger    HPI: Nicholas Rollins is a 27 y.o. male patient with a past psychiatric history of MDD, ADHD, PTSD, ODD, IED, anxiety, bipolar II disorder, borderline personality disorder, dissociative and conversion disorder, antisocial personality disorder, panic attacks, psychosis, episodic mood disorder, anger, suicidal and homicidal ideations, suicide attempts, self-harm who presented to [facility] voluntarily and accompanied by his father with complaints of worsening depression, anger, and suicidal ideations. Patient requested for his father to remain in lobby during assessment.   Patient assessed face-to-face by this provider and chart reviewed on 07/30/22. Counselor Melynda Ripple present during assessment. On evaluation, [name] is seated in assessment area in no acute distress. Patient is alert and oriented x4, calm, cooperative, and pleasant. Speech is clear and coherent, normal rate and volume. Eye contact is good. Mood is depressed with congruent affect. Thought process is coherent. Patient reports suicidal ideations and states he has multiple plans, "it's so many that I don't have to think about them." Patient is unable to contract for safety at this time. Patient reports multiple past suicide attempts by "cutting my throat, shooting myself in the head and throat," and overdosing on medication. Patient states he "almost" attempted suicide "a couple nights ago." Patient reports increased symptoms of depression "lately I got so much going on." Patient denies current homicidal ideations however reports today he had fleeting homicidal thoughts towards his dad today with no plan or intent. Patient states "if I don't  hurt someone else I will hurt myself. I'm a risk to others and I'm scared for others." Patient reports anger problems stating "if I get set off I blackout and lose control." Patient states he "snaps" and starts yelling. Patient states 1 week ago "I snapped and put a knife to my neck but my fiance stopped me." Patient reports a history of self-harm by cutting, stating he last cut 1 week ago "when I put a knife to my throat." Patient reports 13 past psychiatric hospitalizations. Patient states he was last at [facility] in 2018. Patient reports auditory hallucinations which he describes as hearing his own voice "saying to do bad things." Patient denies visual hallucinations. Patient denies current symptoms of paranoia but states "I don't like being followed too closely by random people." Patient is able to converse coherently with goal-directed thoughts and no distractibility or preoccupation. Objectively, there is no evidence of psychosis/mania, delusional thinking, or indication that patient is responding to internal or external stimuli.   Patient reports poor sleep (3 hours/night) and good appetite. Patient states he does experience nightmares some nights. Patient states he currently resides with his father and fiance and denies access to weapons/firearms. Patient states he is currently unable to work due to have "seizure-like activity." Patient states he recently went to neurology who "said it was not seizures but stress-induced migraines with seizure-like activity." Patient denies currently taking any medications. Patient states he does not currently have outpatient psychiatric services in place for therapy or medication management. Patient states he last took psychotropic medication around 2018 but he is unable to recall the medication names. Per chart review, patient was discharged from [facility] on 10/13/16 with: carbamazepine 200mg  BID, hydroxyzine 25mg  q6h PRN anxiety, quetiapine 25mg  BID, sertraline 50mg  daily, and  trazodone 100mg   at bedtime PRN sleep. Patient reports he stopped drinking alcohol 3-4 years ago. Patient reports daily use of CBD by vape pen, "I take hits during the day," last use today. Patient denies use of other illicit substances.   Patient offered support and encouragement. Discussed with patient recommendations for inpatient psychiatric treatment. Discussed with patient being admitted to the continuous observation unit while awaiting inpatient psychiatric placement. Discussed with patient restarting sertraline 50mg  daily, hydroxyzine 25mg  TID PRN anxiety, and trazodone 50mg  at bedtime PRN sleep. Patient is in agreement with plan of care.   Total Time spent with patient: 30 minutes  Musculoskeletal  Strength & Muscle Tone: within normal limits Gait & Station: normal Patient leans: N/A  Psychiatric Specialty Exam  Presentation General Appearance:  Appropriate for Environment  Eye Contact: Good  Speech: Clear and Coherent; Normal Rate  Speech Volume: Normal  Handedness: Right   Mood and Affect  Mood: Depressed  Affect: Congruent   Thought Process  Thought Processes: Coherent; Goal Directed  Descriptions of Associations:Intact  Orientation:Full (Time, Place and Person)  Thought Content:Logical    Hallucinations:Hallucinations: Auditory Description of Auditory Hallucinations: Patient describes hearing his own voice "saying to do bad things"  Ideas of Reference:None  Suicidal Thoughts:Suicidal Thoughts: Yes, Active SI Active Intent and/or Plan: With Intent; With Means to Carry Out; With Access to Means; With Plan  Homicidal Thoughts:Homicidal Thoughts: No (Reports passive HI today)   Sensorium  Memory: Immediate Good; Recent Good; Remote Good  Judgment: Fair  Insight: Fair   Art therapist  Concentration: Good  Attention Span: Good  Recall: Good  Fund of Knowledge: Good  Language: Good   Psychomotor Activity  Psychomotor  Activity: Psychomotor Activity: Normal   Assets  Assets: Communication Skills; Desire for Improvement; Financial Resources/Insurance; Housing; Intimacy; Leisure Time; Physical Health; Resilience; Social Support; Transportation   Sleep  Sleep: Sleep: Poor Number of Hours of Sleep: 3   Nutritional Assessment (For OBS and FBC admissions only) Has the patient had a weight loss or gain of 10 pounds or more in the last 3 months?: No Has the patient had a decrease in food intake/or appetite?: No Does the patient have dental problems?: No Does the patient have eating habits or behaviors that may be indicators of an eating disorder including binging or inducing vomiting?: No Has the patient recently lost weight without trying?: 0 Has the patient been eating poorly because of a decreased appetite?: 0 Malnutrition Screening Tool Score: 0    Physical Exam Vitals and nursing note reviewed.  Constitutional:      General: He is not in acute distress.    Appearance: Normal appearance. He is not ill-appearing.  HENT:     Head: Normocephalic and atraumatic.     Nose: Nose normal.  Eyes:     General:        Right eye: No discharge.        Left eye: No discharge.     Conjunctiva/sclera: Conjunctivae normal.  Cardiovascular:     Rate and Rhythm: Normal rate.  Pulmonary:     Effort: Pulmonary effort is normal. No respiratory distress.  Musculoskeletal:        General: Normal range of motion.     Cervical back: Normal range of motion.  Skin:    General: Skin is warm and dry.  Neurological:     General: No focal deficit present.     Mental Status: He is alert and oriented to person, place, and time. Mental status is  at baseline.  Psychiatric:        Attention and Perception: Attention normal. He perceives auditory (Patient describes hearing his own voice "saying to do bad things") hallucinations. He does not perceive visual hallucinations.        Mood and Affect: Mood is depressed.         Speech: Speech normal.        Behavior: Behavior normal. Behavior is cooperative.        Thought Content: Thought content is not paranoid or delusional. Thought content includes suicidal ideation. Thought content does not include homicidal (Reports passive HI today) ideation. Thought content includes suicidal plan. Thought content does not include homicidal plan.        Cognition and Memory: Cognition and memory normal.        Judgment: Judgment normal.    Review of Systems  Constitutional: Negative.   HENT: Negative.    Eyes: Negative.   Respiratory: Negative.    Cardiovascular: Negative.   Gastrointestinal: Negative.   Genitourinary: Negative.   Musculoskeletal: Negative.   Skin: Negative.   Neurological: Negative.   Endo/Heme/Allergies: Negative.   Psychiatric/Behavioral:  Positive for depression, hallucinations (Patient describes hearing his own voice "saying to do bad things"), substance abuse and suicidal ideas. Negative for memory loss. The patient has insomnia. The patient is not nervous/anxious.     Blood pressure (!) 144/82, pulse 84, temperature 98.6 F (37 C), temperature source Oral, resp. rate 18, SpO2 98 %. There is no height or weight on file to calculate BMI.  Past Psychiatric History: MDD, ADHD, PTSD, ODD, IED, anxiety, bipolar II disorder, borderline personality disorder, dissociative and conversion disorder, antisocial personality disorder, panic attacks, psychosis, episodic mood disorder, anger, suicidal and homicidal ideations, suicide attempts, self-harm  Is the patient at risk to self? Yes  Has the patient been a risk to self in the past 6 months? Yes .    Has the patient been a risk to self within the distant past? Yes   Is the patient a risk to others? Yes   Has the patient been a risk to others in the past 6 months? Yes   Has the patient been a risk to others within the distant past? Yes   Past Medical History:  Past Medical History:  Diagnosis Date    ADHD (attention deficit hyperactivity disorder)    Anxiety    Asthma    Broken ankle 2013   Left ankle   Broken wrist 2012   Left wrist   Cyst of brain 2012   Seen by Dr. Lorenso Courier, cleared for sports, found during work-up for severe headahces.  FOund on either MRI or CT scan.    Cyst of left orbit    Possible cyst of left eye; observed during routine eye exam fall 2013, "right next to nerve", was supposed to get a follow-up 03/2012.   Depression    Fatty liver November 2013   resolved with healthier nutrition and increased physical activity   Fractured rib 2011   Headache(784.0)    history of migraines, resolved this with improved overall health   Obesity    Vision abnormalities    myopia   Family History: Patient reports his mother has bipolar disorder and possibly schizophrenia  Social History:  Social History   Tobacco Use   Smoking status: Never   Smokeless tobacco: Never  Substance Use Topics   Alcohol use: No   Drug use: No   Last Labs:  Admission on  07/30/2022  Component Date Value Ref Range Status   POC Amphetamine UR 07/30/2022 None Detected  NONE DETECTED (Cut Off Level 1000 ng/mL) Final   POC Secobarbital (BAR) 07/30/2022 None Detected  NONE DETECTED (Cut Off Level 300 ng/mL) Final   POC Buprenorphine (BUP) 07/30/2022 None Detected  NONE DETECTED (Cut Off Level 10 ng/mL) Final   POC Oxazepam (BZO) 07/30/2022 None Detected  NONE DETECTED (Cut Off Level 300 ng/mL) Final   POC Cocaine UR 07/30/2022 None Detected  NONE DETECTED (Cut Off Level 300 ng/mL) Final   POC Methamphetamine UR 07/30/2022 None Detected  NONE DETECTED (Cut Off Level 1000 ng/mL) Final   POC Morphine 07/30/2022 None Detected  NONE DETECTED (Cut Off Level 300 ng/mL) Final   POC Methadone UR 07/30/2022 None Detected  NONE DETECTED (Cut Off Level 300 ng/mL) Final   POC Oxycodone UR 07/30/2022 None Detected  NONE DETECTED (Cut Off Level 100 ng/mL) Final   POC Marijuana UR 07/30/2022 Positive (A)  NONE  DETECTED (Cut Off Level 50 ng/mL) Final    Allergies: Fluticasone  Medications:  Facility Ordered Medications  Medication   acetaminophen (TYLENOL) tablet 650 mg   alum & mag hydroxide-simeth (MAALOX/MYLANTA) 200-200-20 MG/5ML suspension 30 mL   magnesium hydroxide (MILK OF MAGNESIA) suspension 30 mL   hydrOXYzine (ATARAX) tablet 25 mg   traZODone (DESYREL) tablet 50 mg   [START ON 07/31/2022] sertraline (ZOLOFT) tablet 50 mg   PTA Medications  Medication Sig   albuterol (PROAIR HFA) 108 (90 BASE) MCG/ACT inhaler Inhale 2 puffs into the lungs every 4 (four) hours as needed for wheezing or shortness of breath. (Patient not taking: Reported on 10/06/2016)   carbamazepine (TEGRETOL) 200 MG tablet Take 1 tablet (200 mg total) by mouth 2 (two) times daily. For mood control   traZODone (DESYREL) 100 MG tablet Take 1 tablet (100 mg total) by mouth at bedtime as needed for sleep.   sertraline (ZOLOFT) 50 MG tablet Take 1 tablet (50 mg total) by mouth daily. For mood control   QUEtiapine (SEROQUEL) 100 MG tablet Take 1 tablet (100 mg total) by mouth at bedtime. For mood control   QUEtiapine (SEROQUEL) 25 MG tablet Take 1 tablet (25 mg total) by mouth 2 (two) times daily. For mood control   propranolol (INDERAL) 10 MG tablet Take 1 tablet (10 mg total) by mouth 3 (three) times daily. For HTN   hydrOXYzine (ATARAX/VISTARIL) 25 MG tablet Take 1 tablet (25 mg total) by mouth every 6 (six) hours as needed for anxiety.      Medical Decision Making  Gilliam L Mangiaracina was admitted to [facility] continuous assessment unit for suicidal ideation, Anger, crisis management, and stabilization. Routine labs ordered, which include Lab Orders         CBC with Differential/Platelet         Comprehensive metabolic panel         Hemoglobin A1c         Magnesium         Ethanol         Lipid panel         TSH         Prolactin         POCT Urine Drug Screen - (I-Screen)     -EKG Medication Management: Medications started Meds ordered this encounter  Medications   acetaminophen (TYLENOL) tablet 650 mg   alum & mag hydroxide-simeth (MAALOX/MYLANTA) 200-200-20 MG/5ML suspension 30 mL   magnesium  hydroxide (MILK OF MAGNESIA) suspension 30 mL   hydrOXYzine (ATARAX) tablet 25 mg   traZODone (DESYREL) tablet 50 mg   sertraline (ZOLOFT) tablet 50 mg    Will maintain observation checks every 15 minutes for safety. Psychosocial education regarding relapse prevention and self-care; social and communication  Social work will consult with family for collateral information and discuss discharge and follow up plan.   Recommendations  Based on my evaluation the patient does not appear to have an emergency medical condition.  -Restart sertraline 50mg  daily -Restart hydroxyzine 25mg  TID PRN anxiety and trazodone 50mg  at bedtime PRN sleep -Recommend inpatient psychiatric treatment, secure message sent to Gretta Arab, [facility] and Staci Acosta, LCSW regarding disposition  Sunday Corn, NP 07/30/22  7:37 PM

## 2022-07-30 NOTE — ED Notes (Signed)
Pt laying in bed talking with the pt in next stretcher close to him he is cooperative no pain or distress noted will continue to monitor for safety

## 2022-07-31 ENCOUNTER — Inpatient Hospital Stay (HOSPITAL_COMMUNITY)
Admission: AD | Admit: 2022-07-31 | Discharge: 2022-08-06 | DRG: 885 | Disposition: A | Discharge status: Home / Self Care | Payer: Medicaid Other | Source: Intra-hospital | Attending: Psychiatry | Admitting: Psychiatry

## 2022-07-31 ENCOUNTER — Other Ambulatory Visit: Payer: Self-pay

## 2022-07-31 ENCOUNTER — Encounter (HOSPITAL_COMMUNITY): Payer: Self-pay | Admitting: Behavioral Health

## 2022-07-31 DIAGNOSIS — Z9151 Personal history of suicidal behavior: Secondary | ICD-10-CM

## 2022-07-31 DIAGNOSIS — Z79899 Other long term (current) drug therapy: Secondary | ICD-10-CM

## 2022-07-31 DIAGNOSIS — Z818 Family history of other mental and behavioral disorders: Secondary | ICD-10-CM | POA: Diagnosis not present

## 2022-07-31 DIAGNOSIS — F602 Antisocial personality disorder: Secondary | ICD-10-CM | POA: Diagnosis present

## 2022-07-31 DIAGNOSIS — Z555 Less than a high school diploma: Secondary | ICD-10-CM | POA: Diagnosis not present

## 2022-07-31 DIAGNOSIS — F6381 Intermittent explosive disorder: Secondary | ICD-10-CM | POA: Diagnosis present

## 2022-07-31 DIAGNOSIS — F1220 Cannabis dependence, uncomplicated: Secondary | ICD-10-CM | POA: Diagnosis present

## 2022-07-31 DIAGNOSIS — Z560 Unemployment, unspecified: Secondary | ICD-10-CM

## 2022-07-31 DIAGNOSIS — Z91199 Patient's noncompliance with other medical treatment and regimen due to unspecified reason: Secondary | ICD-10-CM

## 2022-07-31 DIAGNOSIS — I10 Essential (primary) hypertension: Secondary | ICD-10-CM | POA: Diagnosis present

## 2022-07-31 DIAGNOSIS — F411 Generalized anxiety disorder: Secondary | ICD-10-CM | POA: Diagnosis present

## 2022-07-31 DIAGNOSIS — F329 Major depressive disorder, single episode, unspecified: Secondary | ICD-10-CM | POA: Diagnosis present

## 2022-07-31 DIAGNOSIS — F603 Borderline personality disorder: Secondary | ICD-10-CM | POA: Diagnosis present

## 2022-07-31 DIAGNOSIS — F332 Major depressive disorder, recurrent severe without psychotic features: Principal | ICD-10-CM | POA: Diagnosis present

## 2022-07-31 DIAGNOSIS — G4700 Insomnia, unspecified: Secondary | ICD-10-CM | POA: Diagnosis present

## 2022-07-31 DIAGNOSIS — R45851 Suicidal ideations: Secondary | ICD-10-CM | POA: Diagnosis present

## 2022-07-31 DIAGNOSIS — R569 Unspecified convulsions: Principal | ICD-10-CM

## 2022-07-31 MED ORDER — DIPHENHYDRAMINE HCL 50 MG/ML IJ SOLN: 50.0000 mg | Freq: Three times a day (TID) | INTRAMUSCULAR | Status: DC | PRN

## 2022-07-31 MED ORDER — MAGNESIUM HYDROXIDE 400 MG/5ML PO SUSP: 30.0000 mL | Freq: Every day | ORAL | Status: DC | PRN

## 2022-07-31 MED ORDER — SERTRALINE HCL 50 MG PO TABS
50.0000 mg | ORAL_TABLET | Freq: Every day | ORAL | Status: DC
  Administered 2022-08-01 – 2022-08-06 (×6): 50 mg via ORAL
  Filled 2022-07-31 (×9): qty 1

## 2022-07-31 MED ORDER — LORAZEPAM 2 MG/ML IJ SOLN: 2.0000 mg | Freq: Three times a day (TID) | INTRAMUSCULAR | Status: DC | PRN

## 2022-07-31 MED ORDER — NICOTINE 21 MG/24HR TD PT24
21.0000 mg | MEDICATED_PATCH | Freq: Every day | TRANSDERMAL | Status: DC
  Administered 2022-07-31: 21 mg via TRANSDERMAL
  Filled 2022-07-31: qty 1

## 2022-07-31 MED ORDER — HYDROXYZINE HCL 25 MG PO TABS
25.0000 mg | ORAL_TABLET | Freq: Three times a day (TID) | ORAL | Status: DC | PRN
  Administered 2022-07-31 – 2022-08-02 (×3): 25 mg via ORAL
  Filled 2022-07-31 (×2): qty 1

## 2022-07-31 MED ORDER — HALOPERIDOL 5 MG PO TABS: 5.0000 mg | ORAL_TABLET | Freq: Three times a day (TID) | ORAL | Status: DC | PRN

## 2022-07-31 MED ORDER — NICOTINE 21 MG/24HR TD PT24
21.0000 mg | MEDICATED_PATCH | Freq: Every day | TRANSDERMAL | Status: DC
  Administered 2022-08-01 – 2022-08-06 (×6): 21 mg via TRANSDERMAL
  Filled 2022-07-31 (×9): qty 1

## 2022-07-31 MED ORDER — TRAZODONE HCL 50 MG PO TABS
50.0000 mg | ORAL_TABLET | Freq: Every evening | ORAL | Status: DC | PRN
  Filled 2022-07-31: qty 1

## 2022-07-31 MED ORDER — DIPHENHYDRAMINE HCL 25 MG PO CAPS: 50.0000 mg | ORAL_CAPSULE | Freq: Three times a day (TID) | ORAL | Status: DC | PRN

## 2022-07-31 MED ORDER — HALOPERIDOL LACTATE 5 MG/ML IJ SOLN: 5.0000 mg | Freq: Three times a day (TID) | INTRAMUSCULAR | Status: DC | PRN

## 2022-07-31 MED ORDER — QUETIAPINE FUMARATE 50 MG PO TABS: 50.0000 mg | ORAL_TABLET | Freq: Every day | ORAL | Status: DC

## 2022-07-31 MED ORDER — QUETIAPINE FUMARATE 50 MG PO TABS
50.0000 mg | ORAL_TABLET | Freq: Every day | ORAL | Status: DC
  Administered 2022-07-31: 50 mg via ORAL
  Filled 2022-07-31 (×4): qty 1

## 2022-07-31 MED ORDER — ALUM & MAG HYDROXIDE-SIMETH 200-200-20 MG/5ML PO SUSP: 30.0000 mL | ORAL | Status: DC | PRN

## 2022-07-31 MED ORDER — LORAZEPAM 1 MG PO TABS: 2.0000 mg | ORAL_TABLET | Freq: Three times a day (TID) | ORAL | Status: DC | PRN

## 2022-07-31 MED ORDER — ACETAMINOPHEN 325 MG PO TABS: 650.0000 mg | ORAL_TABLET | Freq: Four times a day (QID) | ORAL | Status: DC | PRN

## 2022-07-31 MED ORDER — SERTRALINE HCL 50 MG PO TABS: 50.0000 mg | ORAL_TABLET | Freq: Every day | ORAL | Status: DC

## 2022-07-31 NOTE — ED Notes (Signed)
Pt discharged with  AVS. Discharge to [facility] via safe transport  Pt alert, oriented, and ambulatory.  Safety maintained.

## 2022-07-31 NOTE — Progress Notes (Signed)
Pt was accepted to [facility] TODAY 07/31/2022. Bed assignment: 304-1  Pt meets inpatient criteria per Liborio Nixon, NP  Attending Physician will be Phineas Inches, MD  Report can be called to: - Adult unit: [phone]  Pt can anytime; bed is ready now  Care Team Notified: [facility], RN, Liborio Nixon, NP, Durwin Reges, RN, and [address], LCSWA  [place], [place]  07/31/2022 3:17 PM

## 2022-07-31 NOTE — [facility] Group Notes (Signed)
[facility] Group Notes:  (Nursing/MHT/Case Management/Adjunct)  Date:  07/31/2022  Time:  8:09 PM  Type of Therapy:   AA group  Participation Level:  Active  Participation Quality:  Appropriate  Affect:  Appropriate  Cognitive:  Appropriate  Insight:  Appropriate  Engagement in Group:  Engaged  Modes of Intervention:  Education  Summary of Progress/Problems: Attended AA meeting.  Noah Delaine 07/31/2022, 8:09 PM

## 2022-07-31 NOTE — ED Provider Notes (Signed)
FBC/OBS ASAP Discharge Summary  Date and Time: 07/31/2022 9:45 AM  Name: Nicholas Rollins  MRN:  [id number]   Discharge Diagnoses:  Final diagnoses:  MDD (major depressive disorder), recurrent severe, without psychosis (HCC)  Suicidal ideation  Anger    Subjective: Patient seen and evaluated face-to-face by this provider, chart reviewed and case discussed with Dr. Lucianne Muss. On evaluation, patient is alert and oriented x 4. His thought process is linear and speech is clear and coherent at a moderate tone. His mood is depressed and affect is congruent. He has fair eye contact. He is calm and cooperative and does not appear to be in acute distress. He continues to endorse suicidal ideations with a plan and intent. He does not state a plan at this time. He states that the suicidal thoughts do not stop with the level of depression that he is experiencing. He states that he has been feeling suicidal nonstop for the past week and a half. He states that a week and a half ago he tried to cut his neck with a knife. He proceeds to show this provider in a superficial self-inflicted cut to his anterior neck. Skin intact at site, no redness or draining noted on exam. He states that he has tried to kill himself over 27 times. He reports feeling depressed for the past 3 to 4 years. He describes his depression as lack of appetite, anhedonia, difficulty sleeping, sometimes oversleeping, no joy or happiness, guilt, crying spells, and anger. He reports sleeping 3 hours per night. He denies homicidal ideations. He denies auditory or visual hallucinations. There is no objective evidence that the patient is currently responding to internal or external stimuli. I discussed with the patient that in the past he was prescribed Seroquel and discussed the risk and benefits of restarting the Seroquel at bedtime to help with sleep and mood stabilization. Patient agreeable to the stated plan of care.  Stay Summary:  Per chart review,  H&P by Erskine Emery, NP: Darlis Rollins is a 27 y.o. male patient with a past psychiatric history of MDD, ADHD, PTSD, ODD, IED, anxiety, bipolar II disorder, borderline personality disorder, dissociative and conversion disorder, antisocial personality disorder, panic attacks, psychosis, episodic mood disorder, anger, suicidal and homicidal ideations, suicide attempts, self-harm who presented to [facility] voluntarily and accompanied by his father with complaints of worsening depression, anger, and suicidal ideations. Patient requested for his father to remain in lobby during assessment.    Patient assessed face-to-face by this provider and chart reviewed on 07/30/22. Counselor Melynda Ripple present during assessment. On evaluation, Nicholas Rollins is seated in assessment area in no acute distress. Patient is alert and oriented x4, calm, cooperative, and pleasant. Speech is clear and coherent, normal rate and volume. Eye contact is good. Mood is depressed with congruent affect. Thought process is coherent. Patient reports suicidal ideations and states he has multiple plans, "it's so many that I don't have to think about them." Patient is unable to contract for safety at this time. Patient reports multiple past suicide attempts by "cutting my throat, shooting myself in the head and throat," and overdosing on medication. Patient states he "almost" attempted suicide "a couple nights ago." Patient reports increased symptoms of depression "lately I got so much going on." Patient denies current homicidal ideations however reports today he had fleeting homicidal thoughts towards his dad today with no plan or intent. Patient states "if I don't hurt someone else I will hurt myself. I'm a risk to  others and I'm scared for others." Patient reports anger problems stating "if I get set off I blackout and lose control." Patient states he "snaps" and starts yelling. Patient states 1 week ago "I snapped and put a knife to my neck  but my fiance stopped me." Patient reports a history of self-harm by cutting, stating he last cut 1 week ago "when I put a knife to my throat." Patient reports 13 past psychiatric hospitalizations. Patient states he was last at [facility] in 2018. Patient reports auditory hallucinations which he describes as hearing his own voice "saying to do bad things." Patient denies visual hallucinations. Patient denies current symptoms of paranoia but states "I don't like being followed too closely by random people." Patient is able to converse coherently with goal-directed thoughts and no distractibility or preoccupation. Objectively, there is no evidence of psychosis/mania, delusional thinking, or indication that patient is responding to internal or external stimuli.    Patient reports poor sleep (3 hours/night) and good appetite. Patient states he does experience nightmares some nights. Patient states he currently resides with his father and fiance and denies access to weapons/firearms. Patient states he is currently unable to work due to have "seizure-like activity." Patient states he recently went to neurology who "said it was not seizures but stress-induced migraines with seizure-like activity." Patient denies currently taking any medications. Patient states he does not currently have outpatient psychiatric services in place for therapy or medication management. Patient states he last took psychotropic medication around 2018 but he is unable to recall the medication names. Per chart review, patient was discharged from [facility] on 10/13/16 with: carbamazepine 200mg  BID, hydroxyzine 25mg  q6h PRN anxiety, quetiapine 25mg  BID, sertraline 50mg  daily, and trazodone 100mg  at bedtime PRN sleep. Patient reports he stopped drinking alcohol 3-4 years ago. Patient reports daily use of CBD by vape pen, "I take hits during the day," last use today. Patient denies use of other illicit substances.   Total Time spent with patient: 20  minutes  Past Psychiatric History: a past psychiatric history of MDD, ADHD, PTSD, ODD, IED, anxiety, bipolar II disorder, borderline personality disorder, dissociative and conversion disorder, antisocial personality disorder, panic attacks, psychosis, episodic mood disorder, anger, suicidal and homicidal ideations, suicide attempts, self-harm. Patient denies outpatient psychiatry or therapy currently.   Past Medical History: migraines, seizure like activity related to migraines, asthma, and "digestive issues"  Family Psychiatric History: Mother has a reported history of bipolar and possible schizophrenia.   Social History: Patient resides with this father and fiance. Patient denies access to firearms. Patient is unemployed. Patient reports daily CBD use.   Tobacco Cessation:  A prescription for an FDA-approved tobacco cessation medication provided at discharge  Current Medications:  Current Facility-Administered Medications  Medication Dose Route Frequency Provider Last Rate Last Admin   acetaminophen (TYLENOL) tablet 650 mg  650 mg Oral Q6H PRN Sunday Corn, NP       alum & mag hydroxide-simeth (MAALOX/MYLANTA) 200-200-20 MG/5ML suspension 30 mL  30 mL Oral Q4H PRN Sunday Corn, NP       hydrOXYzine (ATARAX) tablet 25 mg  25 mg Oral TID PRN Sunday Corn, NP   25 mg at 07/30/22 2136   magnesium hydroxide (MILK OF MAGNESIA) suspension 30 mL  30 mL Oral Daily PRN Sunday Corn, NP       nicotine (NICODERM CQ - dosed in mg/24 hours) patch 21 mg  21 mg Transdermal Daily Iridessa Harrow L, NP   21  mg at 07/31/22 0908   sertraline (ZOLOFT) tablet 50 mg  50 mg Oral Daily Sunday Corn, NP   50 mg at 07/31/22 0908   traZODone (DESYREL) tablet 50 mg  50 mg Oral QHS PRN Sunday Corn, NP       Current Outpatient Medications  Medication Sig Dispense Refill   esomeprazole (NEXIUM) 40 MG capsule Take 40 mg by mouth daily.     zonisamide (ZONEGRAN) 50 MG capsule Take 50-250 mg  by mouth at bedtime. Start by taking one capsule at bedtime, then increase dose weekly as directed up to 5 capsules nightly at bedtime.     hydrOXYzine (ATARAX/VISTARIL) 25 MG tablet Take 1 tablet (25 mg total) by mouth every 6 (six) hours as needed for anxiety. (Patient not taking: Reported on 07/31/2022) 30 tablet 0   propranolol (INDERAL) 10 MG tablet Take 1 tablet (10 mg total) by mouth 3 (three) times daily. For HTN (Patient not taking: Reported on 07/31/2022) 90 tablet 0   QUEtiapine (SEROQUEL) 100 MG tablet Take 1 tablet (100 mg total) by mouth at bedtime. For mood control (Patient not taking: Reported on 07/31/2022) 30 tablet 0   QUEtiapine (SEROQUEL) 25 MG tablet Take 1 tablet (25 mg total) by mouth 2 (two) times daily. For mood control (Patient not taking: Reported on 07/31/2022) 60 tablet 0   sertraline (ZOLOFT) 50 MG tablet Take 1 tablet (50 mg total) by mouth daily. For mood control (Patient not taking: Reported on 07/31/2022) 30 tablet 0   traZODone (DESYREL) 100 MG tablet Take 1 tablet (100 mg total) by mouth at bedtime as needed for sleep. (Patient not taking: Reported on 07/31/2022) 30 tablet 0    PTA Medications:  Facility Ordered Medications  Medication   acetaminophen (TYLENOL) tablet 650 mg   alum & mag hydroxide-simeth (MAALOX/MYLANTA) 200-200-20 MG/5ML suspension 30 mL   magnesium hydroxide (MILK OF MAGNESIA) suspension 30 mL   hydrOXYzine (ATARAX) tablet 25 mg   traZODone (DESYREL) tablet 50 mg   sertraline (ZOLOFT) tablet 50 mg   nicotine (NICODERM CQ - dosed in mg/24 hours) patch 21 mg   PTA Medications  Medication Sig   esomeprazole (NEXIUM) 40 MG capsule Take 40 mg by mouth daily.   zonisamide (ZONEGRAN) 50 MG capsule Take 50-250 mg by mouth at bedtime. Start by taking one capsule at bedtime, then increase dose weekly as directed up to 5 capsules nightly at bedtime.   traZODone (DESYREL) 100 MG tablet Take 1 tablet (100 mg total) by mouth at bedtime as needed for sleep.  (Patient not taking: Reported on 07/31/2022)   sertraline (ZOLOFT) 50 MG tablet Take 1 tablet (50 mg total) by mouth daily. For mood control (Patient not taking: Reported on 07/31/2022)   QUEtiapine (SEROQUEL) 100 MG tablet Take 1 tablet (100 mg total) by mouth at bedtime. For mood control (Patient not taking: Reported on 07/31/2022)   QUEtiapine (SEROQUEL) 25 MG tablet Take 1 tablet (25 mg total) by mouth 2 (two) times daily. For mood control (Patient not taking: Reported on 07/31/2022)   propranolol (INDERAL) 10 MG tablet Take 1 tablet (10 mg total) by mouth 3 (three) times daily. For HTN (Patient not taking: Reported on 07/31/2022)   hydrOXYzine (ATARAX/VISTARIL) 25 MG tablet Take 1 tablet (25 mg total) by mouth every 6 (six) hours as needed for anxiety. (Patient not taking: Reported on 07/31/2022)        No data to display  Flowsheet Row ED from 07/30/2022 in [facility]  C-SSRS RISK CATEGORY Low Risk       Musculoskeletal  Strength & Muscle Tone: within normal limits Gait & Station: normal Patient leans: N/A  Psychiatric Specialty Exam  Presentation  General Appearance:  Appropriate for Environment  Eye Contact: Fair  Speech: Clear and Coherent  Speech Volume: Normal  Handedness: Right   Mood and Affect  Mood: Depressed  Affect: Congruent   Thought Process  Thought Processes: Coherent  Descriptions of Associations:Intact  Orientation:Full (Time, Place and Person)  Thought Content:Logical     Hallucinations:Hallucinations: None Description of Auditory Hallucinations: Patient describes hearing his own voice "saying to do bad things"  Ideas of Reference:None  Suicidal Thoughts:Suicidal Thoughts: Yes, Active SI Active Intent and/or Plan: With Plan; With Intent  Homicidal Thoughts:Homicidal Thoughts: No   Sensorium  Memory: Immediate Fair; Recent Fair; Remote  Fair  Judgment: Intact  Insight: Present   Executive Functions  Concentration: Fair  Attention Span: Fair  Recall: [employer] of Knowledge: Fair  Language: Fair   Psychomotor Activity  Psychomotor Activity: Psychomotor Activity: Normal   Assets  Assets: Manufacturing systems engineer; Desire for Improvement; Financial Resources/Insurance; Housing; Intimacy; Leisure Time; Physical Health; Social Support; Transportation   Sleep  Sleep: Sleep: Poor Number of Hours of Sleep: 3   Nutritional Assessment (For OBS and FBC admissions only) Has the patient had a weight loss or gain of 10 pounds or more in the last 3 months?: No Has the patient had a decrease in food intake/or appetite?: No Does the patient have dental problems?: No Does the patient have eating habits or behaviors that may be indicators of an eating disorder including binging or inducing vomiting?: No Has the patient recently lost weight without trying?: 0 Has the patient been eating poorly because of a decreased appetite?: 0 Malnutrition Screening Tool Score: 0    Physical Exam  Physical Exam HENT:     Head: Normocephalic.     Nose: Nose normal.  Eyes:     Conjunctiva/sclera: Conjunctivae normal.  Cardiovascular:     Rate and Rhythm: Bradycardia present.  Pulmonary:     Effort: Pulmonary effort is normal.  Musculoskeletal:        General: Normal range of motion.     Cervical back: Normal range of motion.  Neurological:     Mental Status: He is alert and oriented to person, place, and time.    Review of Systems  Constitutional: Negative.   HENT: Negative.    Eyes: Negative.   Respiratory: Negative.    Cardiovascular: Negative.   Gastrointestinal:  Positive for abdominal pain.  Musculoskeletal:  Positive for joint pain.  Neurological: Negative.   Endo/Heme/Allergies: Negative.   Psychiatric/Behavioral:  Positive for depression and suicidal ideas. The patient has insomnia.    Blood pressure  110/61, pulse (!) 54, temperature 98.1 F (36.7 C), temperature source Oral, resp. rate 16, SpO2 99 %. There is no height or weight on file to calculate BMI.   Plan Of Care/Follow-up recommendations:  Activity:  as tolerated.   Current medication regimen:   nicotine  21 mg Transdermal Daily   QUEtiapine  50 mg Oral QHS   sertraline  50 mg Oral Daily    Patient is recommended for inpatient psychiatric treatment. Patient is voluntary.   Disposition: Patient accepted to [facility]-1 to the services of Dr. Sherron Flemings and may arrive today, 07/31/22. EMTALA completed. Admission orders placed for [facility].  Jimya Ciani L, NP 07/31/2022, 9:45 AM

## 2022-07-31 NOTE — ED Notes (Signed)
Rn called safe transport 

## 2022-07-31 NOTE — Progress Notes (Addendum)
Patient ID: Nicholas Rollins, male   DOB: September 27, 1995, 27 y.o.   MRN: [id number]   Initial Treatment Plan 07/31/2022 4:26 PM Nicholas Rollins [id number]    PATIENT STRESSORS: Medication change or noncompliance   Substance abuse     PATIENT STRENGTHS: Average or above average intelligence  Motivation for treatment/growth  Supportive family/friends    PATIENT IDENTIFIED PROBLEMS: Suicidal Ideation  Depression  Substance Use  Financial problems               DISCHARGE CRITERIA:  Improved stabilization in mood, thinking, and/or behavior Need for constant or close observation no longer present Reduction of life-threatening or endangering symptoms to within safe limits Verbal commitment to aftercare and medication compliance  PRELIMINARY DISCHARGE PLAN: Outpatient therapy Return to previous living arrangement Return to previous work or school arrangements  PATIENT/FAMILY INVOLVEMENT: This treatment plan has been presented to and reviewed with the patient, Nicholas Rollins, and/or family member.  The patient and family have been given the opportunity to ask questions and make suggestions.  Tania Ade, RN 07/31/2022, 4:26 PM

## 2022-07-31 NOTE — ED Notes (Signed)
Rn called report to [place] at [employer]

## 2022-07-31 NOTE — ED Notes (Signed)
Patient alert and oriented x 3. Denies SI/HI/AVH. Denies intent or plan to harm self or others. Routine conducted according to faculty protocol. Encourage patient to notify staff with any needs or concerns. Patient verbalized agreement and understanding. Will continue to monitor for safety. 

## 2022-07-31 NOTE — ED Notes (Signed)
Patient in milieu. Environment is secured. Will continue to monitor for safety. 

## 2022-07-31 NOTE — Tx Team (Signed)
Initial Treatment Plan 07/31/2022 6:46 PM Nicholas Rollins [id number]    PATIENT STRESSORS: Financial difficulties   Occupational concerns   Traumatic event     PATIENT STRENGTHS: Capable of independent living  Communication skills  Supportive family/friends  Work skills    PATIENT IDENTIFIED PROBLEMS: Alterations in mood (Depression & anger) "My anger and depression got worse recently, so my dad and girlfriend ask me to come to the hospital".    Risk for self harm "I tried to jump in front of a moving vehicle and try to cut myself within the last 1.5 week"    Financial Strain "I don't have a job right now for 1 year now. I lost my medicaid, I can't afford my medications".              DISCHARGE CRITERIA:  Improved stabilization in mood, thinking, and/or behavior Verbal commitment to aftercare and medication compliance  PRELIMINARY DISCHARGE PLAN: Outpatient therapy Return to previous living arrangement  PATIENT/FAMILY INVOLVEMENT: This treatment plan has been presented to and reviewed with the patient, Nicholas Rollins. The patient have been given the opportunity to ask questions and make suggestions.  Sherryl Manges, RN 07/31/2022, 6:46 PM

## 2022-07-31 NOTE — Progress Notes (Signed)
LCSW Progress Note  [id number]   Nicholas Rollins  07/31/2022  2:04 AM    Inpatient Behavioral Health Placement  Pt meets inpatient criteria per Erskine Emery, NP. There are no available beds within [facility] per [facility] AC Kenisha Herbin,RN. Referral was sent to the following facilities;    Destination  Service Provider Address Phone Fax  [facility]  [address] [place] [address] [phone]  [facility] [address]  [address] [place] [address] [phone]  [facility]  [address] [place] [address] [phone]  [facility]  [address] [place], [place] [address] [phone]  [facility] [place] [address] [phone]  [facility]  [address] [place] [address] [phone]  [facility]  [address] [place] [address] [phone]  [facility]  [address] [place] [address] [phone]  [facility]  [address] [place], [place] [address] [phone]  [facility]  [address] [place] [address] [phone]  [facility]  [address], [place] [address] [phone]  [facility]  [address] [place] [address] [phone]  [facility]  [address], [place] [address] [phone]  [facility]  [address] [place] [address] [phone]  [facility]  [address], [place] [address] [phone]  [facility]  [address], [place] [address] [phone]   [facility]  [address] [place] [address] [phone]  [facility]  [address], [place] [address] [phone]  [facility]  [address], [place] [address] [phone]  [facility]  [address] [place] [address] [phone]  [facility]  [address], [place] [address] [phone]  [facility]  [address] [place] [address] [phone]  [facility]  [address] [place] [address] [phone]  [facility]  [address] [place] [address] [phone]  [facility]  [address], [place] [address] [phone]  [facility]  [address] [place], [place] [address] [phone]  [facility] [address] [place]  [address], [place] [address] [phone]  [facility]  [address] [place] [address] [phone]   Situation ongoing,  CSW will follow up.    Maryjean Ka, MSW, LCSWA 07/31/2022 2:04 AM

## 2022-07-31 NOTE — Discharge Instructions (Addendum)
Transfer to [facility] 

## 2022-07-31 NOTE — ED Notes (Signed)
Pt sleeping at present, no distress noted.  Monitoring for safety. 
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
## 2022-08-01 ENCOUNTER — Other Ambulatory Visit: Payer: Self-pay

## 2022-08-01 ENCOUNTER — Encounter (HOSPITAL_COMMUNITY): Payer: Self-pay | Admitting: Behavioral Health

## 2022-08-01 DIAGNOSIS — F1220 Cannabis dependence, uncomplicated: Secondary | ICD-10-CM | POA: Diagnosis present

## 2022-08-01 DIAGNOSIS — G4700 Insomnia, unspecified: Secondary | ICD-10-CM | POA: Diagnosis present

## 2022-08-01 DIAGNOSIS — F411 Generalized anxiety disorder: Secondary | ICD-10-CM | POA: Diagnosis present

## 2022-08-01 LAB — PROLACTIN: Prolactin: 9.5 ng/mL (ref 3.6–31.5)

## 2022-08-01 LAB — HEMOGLOBIN A1C
Hgb A1c MFr Bld: 5 % (ref 4.8–5.6)
Mean Plasma Glucose: 97 mg/dL

## 2022-08-01 LAB — GLUCOSE, CAPILLARY: Glucose-Capillary: 120 mg/dL — ABNORMAL HIGH (ref 70–99)

## 2022-08-01 MED ORDER — PROPRANOLOL HCL 10 MG PO TABS
10.0000 mg | ORAL_TABLET | Freq: Three times a day (TID) | ORAL | Status: DC
  Administered 2022-08-01 – 2022-08-05 (×12): 10 mg via ORAL
  Filled 2022-08-01 (×19): qty 1

## 2022-08-01 MED ORDER — LORAZEPAM 2 MG/ML IJ SOLN
2.0000 mg | Freq: Once | INTRAMUSCULAR | Status: AC
  Administered 2022-08-01: 2 mg via INTRAMUSCULAR

## 2022-08-01 MED ORDER — ACETAMINOPHEN 325 MG PO TABS
650.0000 mg | ORAL_TABLET | Freq: Once | ORAL | Status: AC
  Administered 2022-08-01: 650 mg via ORAL
  Filled 2022-08-01: qty 2

## 2022-08-01 MED ORDER — TRAZODONE HCL 100 MG PO TABS
100.0000 mg | ORAL_TABLET | Freq: Every evening | ORAL | Status: DC | PRN
  Filled 2022-08-01 (×3): qty 1

## 2022-08-01 MED ORDER — LORAZEPAM 2 MG/ML IJ SOLN
INTRAMUSCULAR | Status: AC
  Filled 2022-08-01: qty 1

## 2022-08-01 MED ORDER — CARBAMAZEPINE 200 MG PO TABS
200.0000 mg | ORAL_TABLET | Freq: Two times a day (BID) | ORAL | Status: DC
  Administered 2022-08-01 – 2022-08-03 (×4): 200 mg via ORAL
  Filled 2022-08-01 (×10): qty 1

## 2022-08-01 MED ORDER — CLONIDINE HCL 0.1 MG PO TABS: 0.1000 mg | ORAL_TABLET | Freq: Two times a day (BID) | ORAL | Status: DC | PRN

## 2022-08-01 MED ORDER — QUETIAPINE FUMARATE 25 MG PO TABS
25.0000 mg | ORAL_TABLET | Freq: Two times a day (BID) | ORAL | Status: DC
  Administered 2022-08-01 – 2022-08-06 (×10): 25 mg via ORAL
  Filled 2022-08-01 (×14): qty 1

## 2022-08-01 MED ORDER — QUETIAPINE FUMARATE 100 MG PO TABS
100.0000 mg | ORAL_TABLET | Freq: Every day | ORAL | Status: DC
  Administered 2022-08-01 – 2022-08-05 (×5): 100 mg via ORAL
  Filled 2022-08-01 (×7): qty 1

## 2022-08-01 NOTE — Progress Notes (Signed)
   07/31/22 2000  Psych Admission Type (Psych Patients Only)  Admission Status Voluntary  Psychosocial Assessment  Patient Complaints Anger;Depression  Eye Contact Fair  Facial Expression Other (Comment)  Affect Appropriate to circumstance  Speech Logical/coherent  Interaction Attention-seeking  Motor Activity Slow  Appearance/Hygiene Unremarkable  Behavior Characteristics Cooperative  Mood Depressed;Anxious  Aggressive Behavior  Targets Self  Thought Process  Coherency Circumstantial  Content Blaming others;Blaming self  Delusions None reported or observed  Perception WDL  Hallucination None reported or observed  Judgment WDL  Confusion None  Danger to Self  Current suicidal ideation? Passive  Agreement Not to Harm Self Yes  Description of Agreement verbal  Danger to Others  Danger to Others None reported or observed   Patient complaint with medications interacting well with Staff and Peers no medications adverse effects noted. Support and encouragement provided.

## 2022-08-01 NOTE — ED Triage Notes (Addendum)
Pt BIB [facility] pt coming from [facility]. EMS report pt was in a group therapy session and started having "shaking" but remained A/Ox4 during the episode. [facility] admin 2mg  of Ativan prior to EMS arrival.   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 Pt states he was prescribed Zonegran, but he was unable to start it prior to entering [facility]. Pt states he has them when he has a lack of sleep and pt states he has only been sleeping 3 hours per night. Pt reports that he felt the seizure coming on and pt reports that he does not remember things after it started. Staff at [facility] confirm pt did not go unconscious. No tongue trauma or incontinence present.   EMS Vitals  140/100 HR 71 SpO2 98% on R/A  CBG 120

## 2022-08-01 NOTE — [facility] Group Notes (Addendum)
[facility] Group Notes:  (Nursing/MHT/Case Management/Adjunct)  Date:  08/01/2022  Time:  9:29 PM  Type of Therapy:   Wrap-up group  Participation Level:  Active  Participation Quality:  Appropriate  Affect:  Appropriate  Cognitive:  Appropriate  Insight:  Appropriate  Engagement in Group:  Engaged  Modes of Intervention:  Education  Summary of Progress/Problems: Pt goal to work on D/C. Day 9/10.   Noah Delaine 08/01/2022, 9:29 PM

## 2022-08-01 NOTE — Group Note (Signed)
Date:  08/01/2022 Time:  6:40 PM  Group Topic/Focus:  Dimensions of Wellness:   The focus of this group is to introduce the topic of wellness and discuss the role each dimension of wellness plays in total health.    Participation Level:  Did Not Attend  Participation Quality:   n/a  Affect:   n/a  Cognitive:   n/a  Insight: None  Engagement in Group:   n/a  Modes of Intervention:   n/a  Additional Comments:   Pt did not attend.  Edmund Hilda Dozier Berkovich 08/01/2022, 6:40 PM

## 2022-08-01 NOTE — Progress Notes (Signed)
Handoff report given to ED nurse Mickie Bail, RN
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
## 2022-08-01 NOTE — Progress Notes (Signed)
   08/01/22 0738  Psych Admission Type (Psych Patients Only)  Admission Status Voluntary  Psychosocial Assessment  Patient Complaints None  Eye Contact Fair  Facial Expression Animated  Affect Appropriate to circumstance  Speech Logical/coherent  Interaction Assertive  Motor Activity Other (Comment) (WDL)  Appearance/Hygiene Unremarkable  Behavior Characteristics Cooperative  Mood Pleasant  Thought Process  Coherency WDL  Content WDL  Delusions None reported or observed  Perception WDL  Hallucination None reported or observed  Judgment Poor  Confusion None  Danger to Self  Current suicidal ideation? Denies  Agreement Not to Harm Self Yes  Description of Agreement verbal  Danger to Others  Danger to Others None reported or observed

## 2022-08-01 NOTE — Progress Notes (Addendum)
Patient ID: Nicholas Rollins, male   DOB: April 30, 1995, 27 y.o.   MRN: [id number] Patient with rhythmic & jerky body movements while in the day room with other patients. Episode lasted approximately 5 seconds as per staff reports. Pt did not hit his head, and was lowered to the floor by staff. Pt has a history of non epileptic seizures per his reports, given 2 mg IM Ativan, Orders given for pt to be transferred to the [facility] ER for further medical evaluation and clearance. Can return to [facility] once medically cleared.

## 2022-08-01 NOTE — Progress Notes (Signed)
   08/01/22 0738  Charting Type  Charting Type Shift assessment  Safety Check Verification  Has the RN verified the 15 minute safety check completion? Yes  Neurological  Neuro (WDL) WDL  HEENT  HEENT (WDL) WDL  Respiratory  Respiratory (WDL) WDL  Cardiac  Cardiac (WDL) WDL  Vascular  Vascular (WDL) WDL  Integumentary  Integumentary (WDL) WDL  Braden Scale (Ages 8 and up)  Sensory Perceptions 4  Moisture 4  Activity 4  Mobility 4  Nutrition 3  Friction and Shear 3  Braden Scale Score 22  Musculoskeletal  Musculoskeletal (WDL) WDL  Assistive Device None  Gastrointestinal  Gastrointestinal (WDL) WDL  GU Assessment  Genitourinary (WDL) WDL  Neurological  Level of Consciousness Alert

## 2022-08-01 NOTE — ED Provider Notes (Signed)
[place] EMERGENCY DEPARTMENT AT [facility] Provider Note   CSN: [id number] Arrival date & time: 08/01/22  1523     History  Chief Complaint  Patient presents with   Seizures    Heron L Motola is a 27 y.o. male.  HPI   27 y.o. male patient with a past psychiatric history of MDD, ADHD, PTSD, ODD, IED, anxiety, bipolar II disorder, borderline personality disorder, dissociative and conversion disorder, antisocial personality disorder, panic attacks, psychosis, episodic mood disorder, anger, suicidal and homicidal ideations, suicide attempts, self-harm who presents to the ER after an episode of seizure-like activity.   The patient initially presented to the [facility] with suicidal ideation.  It been recommended that he be inpatient for further care.  She was at [facility] in a group therapy session when he reportedly started shaking.  She maintained consciousness throughout the episode and alert and oriented x 4 during the episode.  He was administered 2 mg of Ativan prior to EMS arrival.  No urinary incontinence or tongue biting.  The patient is back to his baseline.    On chart review, the patient follows with [facility] neurology [facility] and per MDM notes 6/10, he has "seizure-like activity, probably functional events" and MRI imaging outpatient has been ordered and he has been started on Zonegran 50mg  at bedtime for migraines. Per notes neuro doubts true seizures.   Patient states that today during his therapy session, he started to feel separate from his body and voices started to feel further away.  He then felt tremulous and then his whole body started shaking.  He remembers the entire event and was conscious throughout.  No urinary incontinence or tongue biting.  He is back to his baseline.  He endorses a 2 out of 10 headache in his posterior occiput.  No nausea or vomiting, no vision changes, no neurologic deficits, no light sensitivity or sound  sensitivity.  He denies any SI, HI or AVH.  Home Medications Prior to Admission medications   Medication Sig Start Date End Date Taking? Authorizing Provider  esomeprazole (NEXIUM) 40 MG capsule Take 40 mg by mouth daily. 07/27/22   [provider]  QUEtiapine (SEROQUEL) 50 MG tablet Take 1 tablet (50 mg total) by mouth at bedtime. 07/31/22   White, Patrice L, NP  sertraline (ZOLOFT) 50 MG tablet Take 1 tablet (50 mg total) by mouth daily. 08/01/22   White, Patrice L, NP  zonisamide (ZONEGRAN) 50 MG capsule Take 50-250 mg by mouth at bedtime. Start by taking one capsule at bedtime, then increase dose weekly as directed up to 5 capsules nightly at bedtime.    [provider]      Allergies    Fluticasone    Review of Systems   Review of Systems  All other systems reviewed and are negative.   Physical Exam Updated Vital Signs BP (!) 158/90 (BP Location: Right Arm)   Pulse 88   Temp 97.7 F (36.5 C) (Oral)   Resp 20   Ht 6\' 2"  (1.88 m)   Wt 119.7 kg   SpO2 99%   BMI 33.90 kg/m  Physical Exam Vitals and nursing note reviewed.  Constitutional:      General: He is not in acute distress.    Appearance: He is well-developed.  HENT:     Head: Normocephalic and atraumatic.  Eyes:     Conjunctiva/sclera: Conjunctivae normal.  Cardiovascular:     Rate and Rhythm: Normal rate and  regular rhythm.  Pulmonary:     Effort: Pulmonary effort is normal. No respiratory distress.     Breath sounds: Normal breath sounds.  Abdominal:     Palpations: Abdomen is soft.     Tenderness: There is no abdominal tenderness.  Musculoskeletal:        General: No swelling.     Cervical back: Neck supple.  Skin:    General: Skin is warm and dry.     Capillary Refill: Capillary refill takes less than 2 seconds.  Neurological:     Mental Status: He is alert.     Comments: MENTAL STATUS EXAM:    Orientation: Alert and oriented to person, place and time.  Memory: Cooperative, follows  commands well.  Language: Speech is clear and language is normal.   CRANIAL NERVES:    CN 2 (Optic): Visual fields intact to confrontation.  CN 3,4,6 (EOM): Pupils equal and reactive to light. Full extraocular eye movement without nystagmus.  CN 5 (Trigeminal): Facial sensation is normal, no weakness of masticatory muscles.  CN 7 (Facial): No facial weakness or asymmetry.  CN 8 (Auditory): Auditory acuity grossly normal.  CN 9,10 (Glossophar): The uvula is midline, the palate elevates symmetrically.  CN 11 (spinal access): Normal sternocleidomastoid and trapezius strength.  CN 12 (Hypoglossal): The tongue is midline. No atrophy or fasciculations.Marland Kitchen   MOTOR:  Muscle Strength: 5/5RUE, 5/5LUE, 5/5RLE, 5/5LLE.   COORDINATION:   Intact finger-to-nose, no tremor.   SENSATION:   Intact to light touch all four extremities.     Psychiatric:        Mood and Affect: Mood normal.     ED Results / Procedures / Treatments   Labs (all labs ordered are listed, but only abnormal results are displayed) Labs Reviewed  GLUCOSE, CAPILLARY - Abnormal; Notable for the following components:      Result Value   Glucose-Capillary 120 (*)    All other components within normal limits  CBG MONITORING, ED    EKG None  Radiology No results found.  Procedures Procedures    Medications Ordered in ED Medications  QUEtiapine (SEROQUEL) tablet 50 mg (50 mg Oral Given 07/31/22 2123)  sertraline (ZOLOFT) tablet 50 mg (50 mg Oral Given 08/01/22 0738)  nicotine (NICODERM CQ - dosed in mg/24 hours) patch 21 mg (21 mg Transdermal Patch Applied 08/01/22 0738)  acetaminophen (TYLENOL) tablet 650 mg (has no administration in time range)  alum & mag hydroxide-simeth (MAALOX/MYLANTA) 200-200-20 MG/5ML suspension 30 mL (has no administration in time range)  magnesium hydroxide (MILK OF MAGNESIA) suspension 30 mL (has no administration in time range)  haloperidol (HALDOL) tablet 5 mg (has no administration in time  range)    Or  haloperidol lactate (HALDOL) injection 5 mg (has no administration in time range)  LORazepam (ATIVAN) tablet 2 mg (has no administration in time range)    Or  LORazepam (ATIVAN) injection 2 mg (has no administration in time range)  diphenhydrAMINE (BENADRYL) capsule 50 mg (has no administration in time range)    Or  diphenhydrAMINE (BENADRYL) injection 50 mg (has no administration in time range)  hydrOXYzine (ATARAX) tablet 25 mg (25 mg Oral Given 07/31/22 2124)  traZODone (DESYREL) tablet 50 mg (has no administration in time range)  LORazepam (ATIVAN) 2 MG/ML injection (has no administration in time range)  LORazepam (ATIVAN) injection 2 mg (2 mg Intramuscular Given 08/01/22 1452)    ED Course/ Medical Decision Making/ A&P  Medical Decision Making   27 y.o. male patient with a past psychiatric history of MDD, ADHD, PTSD, ODD, IED, anxiety, bipolar II disorder, borderline personality disorder, dissociative and conversion disorder, antisocial personality disorder, panic attacks, psychosis, episodic mood disorder, anger, suicidal and homicidal ideations, suicide attempts, self-harm who presents to the ER after an episode of seizure-like activity.   The patient initially presented to the [facility] with suicidal ideation.  It been recommended that he be inpatient for further care.  She was at [facility] in a group therapy session when he reportedly started shaking.  She maintained consciousness throughout the episode and alert and oriented x 4 during the episode.  He was administered 2 mg of Ativan prior to EMS arrival.  No urinary incontinence or tongue biting.  The patient is back to his baseline.    On chart review, the patient follows with [facility] neurology [facility] and per MDM notes 6/10, he has "seizure-like activity, probably functional events" and MRI imaging outpatient has been ordered and he has been started on Zonegran  50mg  at bedtime for migraines. Per notes neuro doubts true seizures.   Patient states that today during his therapy session, he started to feel separate from his body and voices started to feel further away.  He then felt tremulous and then his whole body started shaking.  He remembers the entire event and was conscious throughout.  No urinary incontinence or tongue biting.  He is back to his baseline.  He endorses a 2 out of 10 headache in his posterior occiput.  No nausea or vomiting, no vision changes, no neurologic deficits, no light sensitivity or sound sensitivity.  He denies any SI, HI or AVH.  On arrival, the patient was vitally stable.  CBG 120.  Patient presenting with seizure-like activity, no back to his baseline.  Presenting for medical clearance prior to admission back to [facility].  Patient with a known history of seizure-like activity, suspected functional etiology per outpatient neurology, currently undergoing outpatient workup.  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  Neurologically intact and back to his baseline.  Cleared for readmission back to [facility].  Final Clinical Impression(s) / ED Diagnoses Final diagnoses:  Seizure-like activity [facility])    Rx / DC Orders ED Discharge Orders     None         Ernie Avena, MD 08/01/22 [phone]

## 2022-08-01 NOTE — Discharge Instructions (Addendum)
-  Follow-up with your outpatient psychiatric provider -instructions on appointment date, time, and address (location) are provided to you in discharge paperwork.  -Take your psychiatric medications as prescribed at discharge - instructions are provided to you in the discharge paperwork  -Follow-up with outpatient primary care doctor and other specialists -for management of preventative medicine and any chronic medical disease.  -Recommend abstinence from alcohol, tobacco, and other illicit drug use at discharge.   -If your psychiatric symptoms recur, worsen, or if you have side effects to your psychiatric medications, call your outpatient psychiatric provider, 911, [phone] or go to the nearest emergency department.  -If suicidal thoughts occur, call your outpatient psychiatric provider, 911, [phone] or go to the nearest emergency department.  Naloxone (Narcan) can help reverse an overdose when given to the victim quickly.  [place] offers free naloxone kits and instructions/training on its use.  Add naloxone to your first aid kit and you can help save a life.   Pick up your free kit at the following locations:   [place]:  [facility], [address] [place] [address] ([phone]) [facility] [address] [place] [id number] ([phone]) [facility] [address] [place] [address]  [place]: [facility] [address] [place] [address] ([phone]) [facility] [address] [place] [address] ([phone])  

## 2022-08-01 NOTE — ED Notes (Signed)
Safe transport called for transport back to [facility] 
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
## 2022-08-02 DIAGNOSIS — F332 Major depressive disorder, recurrent severe without psychotic features: Secondary | ICD-10-CM

## 2022-08-02 NOTE — Progress Notes (Signed)
   08/02/22 0631  15 Minute Checks  Location Dayroom  Visual Appearance Calm  Behavior Composed  Sleep (Behavioral Health Patients Only)  Calculate sleep? (Click Yes once per 24 hr at 0600 safety check) Yes  Documented sleep last 24 hours 8

## 2022-08-02 NOTE — [facility] Group Notes (Signed)
Adult Psychoeducational Group Note  Date:  08/02/2022 Time:  9:27 PM  Group Topic/Focus:  Wrap-Up Group:   The focus of this group is to help patients review their daily goal of treatment and discuss progress on daily workbooks.  Participation Level:  Active  Participation Quality:  Appropriate  Affect:  Appropriate  Cognitive:  Appropriate  Insight: Appropriate  Engagement in Group:  Engaged  Modes of Intervention:  Discussion  Additional Comments:   Pt states that he had a good day and feels as if his medications are beginning to work and allow him to better control his emotions and anxiety. Pt states that he has issues with seizures and unfortunately had one earlier in the day. Pt denied everything   Vevelyn Pat 08/02/2022, 9:27 PM

## 2022-08-02 NOTE — Progress Notes (Signed)
   08/01/22 2030  Psych Admission Type (Psych Patients Only)  Admission Status Voluntary  Psychosocial Assessment  Patient Complaints None  Eye Contact Brief  Facial Expression Flat  Affect Appropriate to circumstance;Depressed  Speech Logical/coherent  Interaction Assertive  Motor Activity Other (Comment)  Appearance/Hygiene Unremarkable  Behavior Characteristics Cooperative  Mood Pleasant  Thought Process  Coherency WDL  Content WDL  Delusions None reported or observed  Perception WDL  Hallucination None reported or observed  Judgment Poor  Confusion None  Danger to Self  Current suicidal ideation? Denies

## 2022-08-02 NOTE — Plan of Care (Signed)
Nurse discussed anxiety, depression and coping skills with patient.  

## 2022-08-02 NOTE — Group Note (Signed)
Date:  08/02/2022 Time:  5:01 PM  Group Topic/Focus:  Goals Group:   The focus of this group is to help patients establish daily goals to achieve during treatment and discuss how the patient can incorporate goal setting into their daily lives to aide in recovery. Orientation:   The focus of this group is to educate the patient on the purpose and policies of crisis stabilization and provide a format to answer questions about their admission.  The group details unit policies and expectations of patients while admitted.    Participation Level:  Active  Participation Quality:  Attentive and Sharing  Affect:  Appropriate  Cognitive:  Appropriate  Insight: Appropriate  Engagement in Group:  Engaged  Modes of Intervention:  Discussion, Orientation, and Rapport Building  Additional Comments:   Pt attended and participated in the Orientation/ Goals group. Pt shared concerns about treatment schedule, rules and medication. Pt focused on reaching his goal of discharge from treatment.   Nicholas Rollins 08/02/2022, 5:01 PM

## 2022-08-02 NOTE — [facility] Group Notes (Signed)
[facility] Group Notes:  (Nursing)   Date:  08/02/2022  Time:  1400  Type of Therapy:  Nurse Education  Participation Level:  Active  Participation Quality:  Appropriate and Attentive  Affect:  Appropriate  Cognitive:  Alert and Appropriate  Insight:  Appropriate and Good  Engagement in Group:  Engaged  Modes of Intervention:  Discussion, Education, Exploration, Problem-solving, and Support  Summary of Progress/Problems:  Healthy relationships  What makes for a healthy relationship? How to identify a unhealthy relationship?  Shela Nevin 08/02/2022, 2:48 PM

## 2022-08-02 NOTE — Progress Notes (Signed)
D:  Patient's self inventory sheet, patient sleeps good, sleep medication helpful.  Good appetite, normal energy level, good concentration.  Rated depression 3, denied hopeless, rated anxiety 2.  Denied withdrawals.  Denied SI.  Denied physical problems.  Stomach, L shoulder pain, worst pain in past 24 hours is #4.  Goal is work on triggers.  Plans to reflect on past incidents.  Does have discharge plans. A : Medications administered per MD orders.  Emotional support and encouragement given patient. R:  Denied SI and HI, contracts for safety.  Denied A/V hallucinations.  Safety maintained with 15 minute checks.

## 2022-08-02 NOTE — [facility] Suicide Risk Assessment (Signed)
[facility] INPATIENT:  Family/Significant Other Suicide Prevention Education  Suicide Prevention Education:  Education Completed; International aid/development worker,  (name of family member/significant other) has been identified by the patient as the family member/significant other with whom the patient will be residing, and identified as the person(s) who will aid the patient in the event of a mental health crisis (suicidal ideations/suicide attempt).  With written consent from the patient, the family member/significant other has been provided the following suicide prevention education, prior to the and/or following the discharge of the patient.  The suicide prevention education provided includes the following: Suicide risk factors Suicide prevention and interventions National Suicide Hotline telephone number [facility] assessment telephone number [facility] Emergency Assistance 911 [facility] and/or Residential Mobile Crisis Unit telephone number  Request made of family/significant other to: Remove weapons (e.g., guns, rifles, knives), all items previously/currently identified as safety concern.   Remove drugs/medications (over-the-counter, prescriptions, illicit drugs), all items previously/currently identified as a safety concern.  The family member/significant other verbalizes understanding of the suicide prevention education information provided.  The family member/significant other agrees to remove the items of safety concern listed above.  Nicholas Rollins, [place] 08/02/2022, 1:19 PM

## 2022-08-02 NOTE — Group Note (Signed)
[facility] LCSW Group Therapy Note    Group Date: 08/02/2022 Start Time: 1000 End Time: 1100  Type of Therapy and Topic:  Group Therapy:  Overcoming Obstacles  Participation Level:  [facility] PARTICIPATION LEVEL: Did Not Attend   Description of Group:   In this group patients will be encouraged to explore what they see as obstacles to their own wellness and recovery. They will be guided to discuss their thoughts, feelings, and behaviors related to these obstacles. The group will process together ways to cope with barriers, with attention given to specific choices patients can make. Each patient will be challenged to identify changes they are motivated to make in order to overcome their obstacles. This group will be process-oriented, with patients participating in exploration of their own experiences as well as giving and receiving support and challenge from other group members.  Therapeutic Goals: 1. Patient will identify personal and current obstacles as they relate to admission. 2. Patient will identify barriers that currently interfere with their wellness or overcoming obstacles.  3. Patient will identify feelings, thought process and behaviors related to these barriers. 4. Patient will identify two changes they are willing to make to overcome these obstacles:    Summary of Patient Progress Pt did not attend group.     Therapeutic Modalities:   Cognitive Behavioral Therapy Solution Focused Therapy Motivational Interviewing Relapse Prevention Therapy   Jacub Waiters A Linette Gunderson, LCSW 

## 2022-08-02 NOTE — Progress Notes (Signed)
Patient was sitting in dayroom, listening in group.  Patient stated he did not feel good.  0915   BP 125/82,  76P,  100% RA, 99.0 T,  R 18  0925  Patient continues to sit in chair in dayroom.  BP 134/94  P 82.  Patient stated he was feeling better,.  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  Stated he ate all his breakfast this morning.    Patient continues group in dayroom.  Stated he is feeling better now.

## 2022-08-02 NOTE — Progress Notes (Addendum)
[facility] MD Progress Note  08/02/2022 2:39 PM Nicholas Rollins  MRN:  [id number]  Principal Problem: MDD (major depressive disorder), recurrent severe, without psychosis (HCC) Diagnosis: Principal Problem:   MDD (major depressive disorder), recurrent severe, without psychosis (HCC) Active Problems:   Intermittent explosive disorder   GAD (generalized anxiety disorder)   Delta-9-tetrahydrocannabinol (THC) dependence (HCC)   Insomnia  Reason for admission:  Nicholas Rollins is a 27 yo Caucasian male with prior mental health history of MDD, borderline personality disorder, intermittent explosive disorder, conversion disorder who presented to the [facility] urgent care on 6/27 with complaints of worsening depressive symptoms along with suicidal ideation with multiple plans for the past few months.  Patient was transferred to this behavioral health Hospital on 6/28 for treatment and stabilization of his mental status.   Yesterday the psychiatry team made the following recommendations: Continue Seroquel 100 mg nightly for mood stabilization and sleep (started by admitting provider) Continue Seroquel 25 mg twice daily for anxiety Continue Tegretol 200 mg twice daily for mood stabilization  Continue sertraline 50 mg daily for depressive symptoms (started by admitting provider)   On assessment today, the pt reports that his mood is less depressed and rates depression as #2/10, with 10 being high severity Reports that anxiety is reduced, and rated anxiety as #2/10, with 10 being high severity.  Patient reports that his "brain is slowing down" and he is no more reacting to environmental stressors like he was doing before the admission.  Further added, the psychotropic medications that I am taking is helping me.  Nursing staff report patient sleeping over 9 hours last night Appetite is good Concentration is improved Energy level is adequate Denies suicidal thoughts.  Denies  suicidal intent or plan.  Denies having any HI.  Denies having psychotic symptoms.   Denies having side effects to current psychiatric medications.  Patient report experiencing seizures like activities this morning.  When asked how long it lasted, patient reports, "within a twinkle of an eye."  Encouraged patient to report seizures activity to the nursing staff as soon as it took place.  We discussed compliance to current medication regimen.  Discussed the following psychosocial stressors: Attending group activities and therapeutic milieu.  Performing his ADLs and taking showers daily for proper hygiene, going to the dining room with other patients for meals.  Total Time spent with patient: 30 minutes  Past Psychiatric History:  Previous Psych Diagnoses: MDD, ODD, intermittent explosive disorder, GAD, borderline personality disorder, conversion disorder. Prior inpatient treatment: Multiple at this hospital with the last 1 being in 2018. Current/prior outpatient treatment: Denies having a current outpatient provider. Prior rehab hx: Denies Psychotherapy hx: Denies History of suicide attempts: Twice as per patient, states he made attempts to cut his neck last week, no marks are visible when patient points to the area.  He also reports that he cut himself in 2017, in both forearms, and took 47 tramadol tablets that belonged to his father.  Patient extend his arms and scars are visible on both. History of homicide or aggression: Aggression as described above via hitting things and self when angry. Psychiatric medication history: Unable to recall most of his medications but medications taking in 2018 as per discharge summary at that time as listed above. Psychiatric medication compliance history: Noncompliant Neuromodulation history: Denies Current Psychiatrist: Denies Current therapist: Denies   Past Medical History:  Past Medical History:  Diagnosis Date   ADHD (attention deficit  hyperactivity disorder)  Anxiety    Asthma    Broken ankle 2013   Left ankle   Broken wrist 2012   Left wrist   Cyst of brain 2012   Seen by Dr. Lorenso Courier, cleared for sports, found during work-up for severe headahces.  FOund on either MRI or CT scan.    Cyst of left orbit    Possible cyst of left eye; observed during routine eye exam fall 2013, "right next to nerve", was supposed to get a follow-up 03/2012.   Depression    Fatty liver November 2013   resolved with healthier nutrition and increased physical activity   Fractured rib 2011   Headache(784.0)    history of migraines, resolved this with improved overall health   Obesity    Vision abnormalities    myopia    Past Surgical History:  Procedure Laterality Date   ADENOIDECTOMY     27yo   Family History: History reviewed. No pertinent family history.  Family Psychiatric  History: See H&P  Social History:  Social History   Substance and Sexual Activity  Alcohol Use No     Social History   Substance and Sexual Activity  Drug Use No    Social History   Socioeconomic History   Marital status: Single    Spouse name: Not on file   Number of children: Not on file   Years of education: Not on file   Highest education level: Not on file  Occupational History   Not on file  Tobacco Use   Smoking status: Never   Smokeless tobacco: Never  Vaping Use   Vaping Use: Every day  Substance and Sexual Activity   Alcohol use: No   Drug use: No   Sexual activity: Never  Other Topics Concern   Not on file  Social History Narrative   Not on file   Social Determinants of Health   Financial Resource Strain: Not on file  Food Insecurity: No Food Insecurity (07/31/2022)   Hunger Vital Sign    Worried About Running Out of Food in the Last Year: Never true    Ran Out of Food in the Last Year: Never true  Recent Concern: Food Insecurity - Food Insecurity Present (07/30/2022)   Hunger Vital Sign    Worried About Running Out  of Food in the Last Year: Never true    Ran Out of Food in the Last Year: Sometimes true  Transportation Needs: No Transportation Needs (07/31/2022)   PRAPARE - Administrator, Civil Service (Medical): No    Lack of Transportation (Non-Medical): No  Physical Activity: Not on file  Stress: Not on file  Social Connections: Not on file   Additional Social History:    Sleep: Good  Appetite:  Good  Current Medications: Current Facility-Administered Medications  Medication Dose Route Frequency Provider Last Rate Last Admin   acetaminophen (TYLENOL) tablet 650 mg  650 mg Oral Q6H PRN White, Patrice L, NP       alum & mag hydroxide-simeth (MAALOX/MYLANTA) 200-200-20 MG/5ML suspension 30 mL  30 mL Oral Q4H PRN White, Patrice L, NP       carbamazepine (TEGRETOL) tablet 200 mg  200 mg Oral BID Starleen Blue, NP   200 mg at 08/02/22 0745   cloNIDine (CATAPRES) tablet 0.1 mg  0.1 mg Oral BID PRN Starleen Blue, NP       diphenhydrAMINE (BENADRYL) capsule 50 mg  50 mg Oral TID PRN Layla Barter, NP  Or   diphenhydrAMINE (BENADRYL) injection 50 mg  50 mg Intramuscular TID PRN White, Patrice L, NP       haloperidol (HALDOL) tablet 5 mg  5 mg Oral TID PRN White, Patrice L, NP       Or   haloperidol lactate (HALDOL) injection 5 mg  5 mg Intramuscular TID PRN White, Patrice L, NP       hydrOXYzine (ATARAX) tablet 25 mg  25 mg Oral TID PRN White, Patrice L, NP   25 mg at 08/01/22 2104   LORazepam (ATIVAN) tablet 2 mg  2 mg Oral TID PRN White, Patrice L, NP       Or   LORazepam (ATIVAN) injection 2 mg  2 mg Intramuscular TID PRN White, Patrice L, NP       magnesium hydroxide (MILK OF MAGNESIA) suspension 30 mL  30 mL Oral Daily PRN White, Patrice L, NP       nicotine (NICODERM CQ - dosed in mg/24 hours) patch 21 mg  21 mg Transdermal Daily White, Patrice L, NP   21 mg at 08/02/22 0744   propranolol (INDERAL) tablet 10 mg  10 mg Oral TID Starleen Blue, NP   10 mg at 08/02/22 0745    QUEtiapine (SEROQUEL) tablet 100 mg  100 mg Oral QHS Nkwenti, Doris, NP   100 mg at 08/01/22 2130   QUEtiapine (SEROQUEL) tablet 25 mg  25 mg Oral BID Starleen Blue, NP   25 mg at 08/02/22 0747   sertraline (ZOLOFT) tablet 50 mg  50 mg Oral Daily White, Patrice L, NP   50 mg at 08/02/22 1610   traZODone (DESYREL) tablet 100 mg  100 mg Oral QHS PRN Starleen Blue, NP       Lab Results:  Results for orders placed or performed during the hospital encounter of 07/31/22 (from the past 48 hour(s))  Glucose, capillary     Status: Abnormal   Collection Time: 08/01/22  2:43 PM  Result Value Ref Range   Glucose-Capillary 120 (H) 70 - 99 mg/dL    Comment: Glucose reference range applies only to samples taken after fasting for at least 8 hours.   Blood Alcohol level:  Lab Results  Component Value Date   ETH <10 07/30/2022   ETH <5 10/06/2016    Metabolic Disorder Labs: Lab Results  Component Value Date   HGBA1C 5.0 07/30/2022   MPG 97 07/30/2022   MPG 96.8 10/08/2016   Lab Results  Component Value Date   PROLACTIN 9.5 07/30/2022   PROLACTIN 22.7 (H) 10/12/2016   Lab Results  Component Value Date   CHOL 153 07/30/2022   TRIG 78 07/30/2022   HDL 54 07/30/2022   CHOLHDL 2.8 07/30/2022   VLDL 16 07/30/2022   LDLCALC 83 07/30/2022   LDLCALC 78 10/08/2016   Physical Findings: AIMS:  , ,  ,  ,    CIWA:    COWS:     Musculoskeletal: Strength & Muscle Tone: within normal limits Gait & Station: normal Patient leans: N/A  Psychiatric Specialty Exam:  Presentation  General Appearance:  Casual; Fairly Groomed  Eye Contact: Good  Speech: Clear and Coherent  Speech Volume: Normal  Handedness: Right  Mood and Affect  Mood: Anxious; Depressed  Affect: Congruent  Thought Process  Thought Processes: Coherent  Descriptions of Associations:Intact  Orientation:Full (Time, Place and Person)  Thought Content:Logical; WDL  History of Schizophrenia/Schizoaffective  disorder:No data recorded Duration of Psychotic Symptoms:No data recorded Hallucinations:Hallucinations: None Description  of Auditory Hallucinations: Denies  Ideas of Reference:None  Suicidal Thoughts:Suicidal Thoughts: No SI Active Intent and/or Plan: -- (Denies)  Homicidal Thoughts:Homicidal Thoughts: No HI Passive Intent and/or Plan: -- (Denies)  Sensorium  Memory: Immediate Good; Recent Good  Judgment: Fair  Insight: Fair  Executive Functions  Concentration: Good  Attention Span: Good  Recall: Fair  Fund of Knowledge: Fair  Language: Fair  Psychomotor Activity  Psychomotor Activity: Psychomotor Activity: Normal  Assets  Assets: Communication Skills; Desire for Improvement; Physical Health; Resilience; Social Support  Sleep  Sleep: Sleep: Good Number of Hours of Sleep: 9  Physical Exam: Physical Exam Vitals and nursing note reviewed.  HENT:     Head: Normocephalic.     Nose: Nose normal.     Mouth/Throat:     Mouth: Mucous membranes are moist.     Pharynx: Oropharynx is clear.  Eyes:     Extraocular Movements: Extraocular movements intact.     Pupils: Pupils are equal, round, and reactive to light.  Cardiovascular:     Rate and Rhythm: Normal rate.     Pulses: Normal pulses.  Pulmonary:     Effort: Pulmonary effort is normal.  Musculoskeletal:        General: Normal range of motion.     Cervical back: Normal range of motion.  Skin:    General: Skin is warm.  Neurological:     General: No focal deficit present.     Mental Status: He is alert and oriented to person, place, and time.  Psychiatric:        Mood and Affect: Mood normal.        Behavior: Behavior normal.    Review of Systems  Constitutional:  Negative for chills and fever.  HENT:  Negative for hearing loss.   Eyes:  Negative for blurred vision.  Respiratory:  Negative for cough, shortness of breath and wheezing.   Cardiovascular:  Negative for chest pain and  palpitations.  Gastrointestinal:  Negative for abdominal pain, heartburn, nausea and vomiting.  Genitourinary:  Negative for dysuria and urgency.  Musculoskeletal:  Negative for myalgias.  Skin:  Negative for itching and rash.  Neurological:  Negative for dizziness, tingling and headaches.  Endo/Heme/Allergies:        See allergy listing  Psychiatric/Behavioral:  Positive for depression. The patient is nervous/anxious and has insomnia.    Blood pressure 131/60, pulse 63, temperature 99 F (37.2 C), temperature source Oral, resp. rate 18, height 6\' 2"  (1.88 m), weight 119.7 kg, SpO2 98 %. Body mass index is 33.9 kg/m.   Treatment Plan Summary: Daily contact with patient to assess and evaluate symptoms and progress in treatment and Medication management   Safety and Monitoring: Voluntary admission to inpatient psychiatric unit for safety, stabilization and treatment Daily contact with patient to assess and evaluate symptoms and progress in treatment Patient's case to be discussed in multi-disciplinary team meeting Observation Level : q15 minute checks Vital signs: q12 hours Precautions: Safety   Long Term Goal(s): Improvement in symptoms so as ready for discharge   Short Term Goals: Ability to identify changes in lifestyle to reduce recurrence of condition will improve, Ability to verbalize feelings will improve, Ability to disclose and discuss suicidal ideas, Ability to demonstrate self-control will improve, Ability to identify and develop effective coping behaviors will improve, Ability to maintain clinical measurements within normal limits will improve, Compliance with prescribed medications will improve, and Ability to identify triggers associated with substance abuse/mental health issues will improve  Diagnoses Principal Problem:   MDD (major depressive disorder), recurrent severe, without psychosis (HCC) Active Problems:   Intermittent explosive disorder   GAD (generalized  anxiety disorder)   Delta-9-tetrahydrocannabinol (THC) dependence (HCC)   Insomnia   Medications:  Patient reports that medications that he was taking during admission in 2018 were effective for stabilization of his mental status, and would like the medications restarted.  Those medications are as listed below:   -Continue Seroquel 100 mg nightly for mood stabilization and sleep (started by admitting provider) -Continue Seroquel 25 mg twice daily for anxiety -Continue Tegretol 200 mg twice daily for mood stabilization  -Continue Sertraline 50 mg daily for depressive symptoms (started by admitting provider)    PRNS -Continue hydroxyzine 25 mg 3 times daily as needed for anxiety -Continue Trazodone 100 mg nightly as needed for sleep -Continue Clonidine 0.1 mg as needed for SBP over 160 or DBP over 100 -Continue agitation protocol medications: Haldol/Benadryl/Ativan 2 mg 3 times daily as needed for agitation -Continue Tylenol 650 mg every 6 hours PRN for mild pain -Continue Maalox 30 mg every 4 hrs PRN for indigestion -Continue Milk of Magnesia as needed every 6 hrs for constipation.   Patient has been educated on the benefits, rationales, and possible side effects of all medications as listed above, verbalizes understanding, and is agreeable to trials.   Labs reviewed: Tox screen positive for THC, reviewed results for hemoglobin A1c, TSH, lipid panel, CBC, and CMP.   Discharge Planning: Social work and case management to assist with discharge planning and identification of hospital follow-up needs prior to discharge Estimated LOS: 5-7 days Discharge Concerns: Need to establish a safety plan; Medication compliance and effectiveness Discharge Goals: Return home with outpatient referrals for mental health follow-up including medication management/psychotherapy   I certify that inpatient services furnished can reasonably be expected to improve the patient's condition.    Cecilie Lowers,  FNP 08/02/2022, 2:39 PM

## 2022-08-03 ENCOUNTER — Encounter (HOSPITAL_COMMUNITY): Payer: Self-pay

## 2022-08-03 LAB — GLUCOSE, CAPILLARY: Glucose-Capillary: 85 mg/dL (ref 70–99)

## 2022-08-03 MED ORDER — GABAPENTIN 100 MG PO CAPS
200.0000 mg | ORAL_CAPSULE | Freq: Three times a day (TID) | ORAL | Status: DC
  Administered 2022-08-03 – 2022-08-06 (×9): 200 mg via ORAL
  Filled 2022-08-03 (×14): qty 2

## 2022-08-03 MED ORDER — CARBAMAZEPINE 100 MG PO CHEW
100.0000 mg | CHEWABLE_TABLET | Freq: Two times a day (BID) | ORAL | Status: AC
  Administered 2022-08-03 – 2022-08-04 (×3): 100 mg via ORAL
  Filled 2022-08-03 (×3): qty 1

## 2022-08-03 NOTE — Group Note (Deleted)
Recreation Therapy Group Note   Group Topic:Problem Solving  Group Date: 08/03/2022 Start Time: 0935 End Time: 1010 Facilitators: Mckenzie Toruno-McCall, [place]ry [place] A, NT Location: [address] Dayroom       Affect/Mood: {RT [facility] Affect/Mood:26271}   Participation Level: {RT [facility] Participation Level:26267}   Participation Quality: {RT [facility] Participation Quality:26268}   Behavior: {RT [facility] Group Behavior:26269}   Speech/Thought Process: {RT [facility] Speech/Thought:26276}   Insight: {RT [facility] Insight:26272}   Judgement: {RT [facility] Judgement:26278}   Modes of Intervention: {RT [facility] Modes of Intervention:26277}   Patient Response to Interventions:  {RT [facility] Patient Response to Intervention:26274}   Education Outcome:  {RT [facility] Education Outcome:26279}   Clinical Observations/Individualized Feedback: *** was *** in their participation of session activities and group discussion. Pt identified ***   Plan: {RT [facility] Tx Plan:26280}   Tanner Yeley A Nashae Maudlin-McCall, NT,  08/03/2022 12:22 PM 

## 2022-08-03 NOTE — Progress Notes (Signed)
[facility] MD Progress Note  08/03/2022 2:44 PM Nicholas Rollins  MRN:  [id number]  Principal Problem: MDD (major depressive disorder), recurrent severe, without psychosis (HCC) Diagnosis: Principal Problem:   MDD (major depressive disorder), recurrent severe, without psychosis (HCC) Active Problems:   Intermittent explosive disorder   GAD (generalized anxiety disorder)   Delta-9-tetrahydrocannabinol (THC) dependence (HCC)   Insomnia  Reason for admission:  Nicholas Rollins is a 27 yo Caucasian male with prior mental health history of MDD, borderline personality disorder, intermittent explosive disorder, conversion disorder who presented to the [facility] urgent care on 6/27 with complaints of worsening depressive symptoms along with suicidal ideation with multiple plans for the past few months.  Patient was transferred to this behavioral health Hospital on 6/28 for treatment and stabilization of his mental status.   24 hr chart review: Sleep Hours last night: Reports a good sleep quality last night  Nursing Concerns: Seizure-like activities x 2 in past 24 hrs Behavioral episodes in the past 24 NWG:NFAO  Medication Compliance: Compliant  Vital Signs in the past 24 hrs: WNL PRN Medications in the past 24 hrs: Hydroxyzine last night   Patient assessment note: Patient presented with rhythmic and jerky body movements during clergy's group session at approximately 1:30 PM today.  There was no loss of consciousness, and no postictal.  Patient was oriented, and awake and alert through entire episode, which lasted a few seconds.  He was able to get up from the chair and ambulate immediately after that, and was encouraged to lie in bed and rest.  Last Saturday when patient first arrived on the unit, he was sent to the [facility] ED for medical clearance for same reason, prior to returning back to [facility].  Today, he complained of a moderate headache after the episode, otherwise no other  complaints.  Patient denies SI/HI/AVH, denies paranoia, denies delusional thinking.  Reports being "happier" than he was prior to admission.  He reports a good sleep quality last night, denies any issues with his bowel movements.  Objectively, mood has improved slightly since hospitalization.  In order to better manage anxiety related to seizure-like activities, we will taper off Tegretol, and add gabapentin for management of anxiety.  We will give Tegretol 100 mg x 3 doses then stop.  We will start gabapentin 200 mg 3 times daily for anxiety, and continue other medications as listed below.  Since we are continuing to adjust medications, patient continues to require inpatient hospitalization at this time for treatment and stabilization of his mental status.  Discharge planning will be revisited on a daily basis.  Episode of seizure-like activity today shows that patient is currently not ready for discharge at this time.  We will continue to monitor.  Total Time spent with patient: 30 minutes  Past Psychiatric History:  Previous Psych Diagnoses: MDD, ODD, intermittent explosive disorder, GAD, borderline personality disorder, conversion disorder. Prior inpatient treatment: Multiple at this hospital with the last 1 being in 2018. Current/prior outpatient treatment: Denies having a current outpatient provider. Prior rehab hx: Denies Psychotherapy hx: Denies History of suicide attempts: Twice as per patient, states he made attempts to cut his neck last week, no marks are visible when patient points to the area.  He also reports that he cut himself in 2017, in both forearms, and took 47 tramadol tablets that belonged to his father.  Patient extend his arms and scars are visible on both. History of homicide or aggression: Aggression as described  above via hitting things and self when angry. Psychiatric medication history: Unable to recall most of his medications but medications taking in 2018 as per  discharge summary at that time as listed above. Psychiatric medication compliance history: Noncompliant Neuromodulation history: Denies Current Psychiatrist: Denies Current therapist: Denies   Past Medical History:  Past Medical History:  Diagnosis Date   ADHD (attention deficit hyperactivity disorder)    Anxiety    Asthma    Broken ankle 2013   Left ankle   Broken wrist 2012   Left wrist   Cyst of brain 2012   Seen by Dr. Lorenso Courier, cleared for sports, found during work-up for severe headahces.  FOund on either MRI or CT scan.    Cyst of left orbit    Possible cyst of left eye; observed during routine eye exam fall 2013, "right next to nerve", was supposed to get a follow-up 03/2012.   Depression    Fatty liver November 2013   resolved with healthier nutrition and increased physical activity   Fractured rib 2011   Headache(784.0)    history of migraines, resolved this with improved overall health   Obesity    Vision abnormalities    myopia    Past Surgical History:  Procedure Laterality Date   ADENOIDECTOMY     27yo   Family History: History reviewed. No pertinent family history.  Family Psychiatric  History: See H&P  Social History:  Social History   Substance and Sexual Activity  Alcohol Use No     Social History   Substance and Sexual Activity  Drug Use No    Social History   Socioeconomic History   Marital status: Single    Spouse name: Not on file   Number of children: Not on file   Years of education: Not on file   Highest education level: Not on file  Occupational History   Not on file  Tobacco Use   Smoking status: Never   Smokeless tobacco: Never  Vaping Use   Vaping Use: Every day  Substance and Sexual Activity   Alcohol use: No   Drug use: No   Sexual activity: Never  Other Topics Concern   Not on file  Social History Narrative   Not on file   Social Determinants of Health   Financial Resource Strain: Not on file  Food Insecurity:  No Food Insecurity (07/31/2022)   Hunger Vital Sign    Worried About Running Out of Food in the Last Year: Never true    Ran Out of Food in the Last Year: Never true  Recent Concern: Food Insecurity - Food Insecurity Present (07/30/2022)   Hunger Vital Sign    Worried About Running Out of Food in the Last Year: Never true    Ran Out of Food in the Last Year: Sometimes true  Transportation Needs: No Transportation Needs (07/31/2022)   PRAPARE - Administrator, Civil Service (Medical): No    Lack of Transportation (Non-Medical): No  Physical Activity: Not on file  Stress: Not on file  Social Connections: Not on file   Additional Social History:    Sleep: Good  Appetite:  Good  Current Medications: Current Facility-Administered Medications  Medication Dose Route Frequency Provider Last Rate Last Admin   acetaminophen (TYLENOL) tablet 650 mg  650 mg Oral Q6H PRN White, Patrice L, NP       alum & mag hydroxide-simeth (MAALOX/MYLANTA) 200-200-20 MG/5ML suspension 30 mL  30 mL Oral Q4H  PRN Layla Barter, NP       carbamazepine (TEGRETOL) chewable tablet 100 mg  100 mg Oral Q12H Massengill, Nathan, MD       cloNIDine (CATAPRES) tablet 0.1 mg  0.1 mg Oral BID PRN Starleen Blue, NP       diphenhydrAMINE (BENADRYL) capsule 50 mg  50 mg Oral TID PRN White, Patrice L, NP       Or   diphenhydrAMINE (BENADRYL) injection 50 mg  50 mg Intramuscular TID PRN White, Patrice L, NP       gabapentin (NEURONTIN) capsule 200 mg  200 mg Oral Q8H Massengill, Nathan, MD       haloperidol (HALDOL) tablet 5 mg  5 mg Oral TID PRN White, Patrice L, NP       Or   haloperidol lactate (HALDOL) injection 5 mg  5 mg Intramuscular TID PRN White, Patrice L, NP       hydrOXYzine (ATARAX) tablet 25 mg  25 mg Oral TID PRN White, Patrice L, NP   25 mg at 08/02/22 2122   LORazepam (ATIVAN) tablet 2 mg  2 mg Oral TID PRN White, Patrice L, NP       Or   LORazepam (ATIVAN) injection 2 mg  2 mg Intramuscular TID  PRN White, Patrice L, NP       magnesium hydroxide (MILK OF MAGNESIA) suspension 30 mL  30 mL Oral Daily PRN White, Patrice L, NP       nicotine (NICODERM CQ - dosed in mg/24 hours) patch 21 mg  21 mg Transdermal Daily White, Patrice L, NP   21 mg at 08/03/22 0748   propranolol (INDERAL) tablet 10 mg  10 mg Oral TID Starleen Blue, NP   10 mg at 08/02/22 2122   QUEtiapine (SEROQUEL) tablet 100 mg  100 mg Oral QHS Mackayla Mullins, NP   100 mg at 08/02/22 2122   QUEtiapine (SEROQUEL) tablet 25 mg  25 mg Oral BID Starleen Blue, NP   25 mg at 08/03/22 0747   sertraline (ZOLOFT) tablet 50 mg  50 mg Oral Daily White, Patrice L, NP   50 mg at 08/03/22 0747   traZODone (DESYREL) tablet 100 mg  100 mg Oral QHS PRN Starleen Blue, NP       Lab Results:  Results for orders placed or performed during the hospital encounter of 07/31/22 (from the past 48 hour(s))  Glucose, capillary     Status: None   Collection Time: 08/03/22  1:31 PM  Result Value Ref Range   Glucose-Capillary 85 70 - 99 mg/dL    Comment: Glucose reference range applies only to samples taken after fasting for at least 8 hours.   Blood Alcohol level:  Lab Results  Component Value Date   ETH <10 07/30/2022   ETH <5 10/06/2016    Metabolic Disorder Labs: Lab Results  Component Value Date   HGBA1C 5.0 07/30/2022   MPG 97 07/30/2022   MPG 96.8 10/08/2016   Lab Results  Component Value Date   PROLACTIN 9.5 07/30/2022   PROLACTIN 22.7 (H) 10/12/2016   Lab Results  Component Value Date   CHOL 153 07/30/2022   TRIG 78 07/30/2022   HDL 54 07/30/2022   CHOLHDL 2.8 07/30/2022   VLDL 16 07/30/2022   LDLCALC 83 07/30/2022   LDLCALC 78 10/08/2016   Physical Findings: AIMS:  , ,  ,  ,    CIWA:    COWS:     Musculoskeletal: Strength &  Muscle Tone: within normal limits Gait & Station: normal Patient leans: N/A  Psychiatric Specialty Exam:  Presentation  General Appearance:  Appropriate for Environment; Fairly  Groomed  Eye Contact: Good  Speech: Clear and Coherent  Speech Volume: Normal  Handedness: Right  Mood and Affect  Mood: Depressed; Anxious  Affect: Congruent  Thought Process  Thought Processes: Coherent  Descriptions of Associations:Intact  Orientation:Partial  Thought Content:Logical  History of Schizophrenia/Schizoaffective disorder:No data recorded Duration of Psychotic Symptoms:No data recorded Hallucinations:Hallucinations: None Description of Auditory Hallucinations: Denies  Ideas of Reference:None  Suicidal Thoughts:Suicidal Thoughts: No SI Active Intent and/or Plan: -- (Denies)  Homicidal Thoughts:Homicidal Thoughts: No HI Passive Intent and/or Plan: -- (Denies)  Sensorium  Memory: Immediate Good  Judgment: Fair  Insight: Fair  Art therapist  Concentration: Fair  Attention Span: Fair  Recall: Fair  Fund of Knowledge: Fair  Language: Fair  Psychomotor Activity  Psychomotor Activity: Psychomotor Activity: Normal  Assets  Assets: Communication Skills  Sleep  Sleep: Sleep: Good Number of Hours of Sleep: 9  Physical Exam: Physical Exam Vitals and nursing note reviewed.  HENT:     Head: Normocephalic.     Nose: Nose normal.     Mouth/Throat:     Mouth: Mucous membranes are moist.     Pharynx: Oropharynx is clear.  Eyes:     Extraocular Movements: Extraocular movements intact.     Pupils: Pupils are equal, round, and reactive to light.  Cardiovascular:     Rate and Rhythm: Normal rate.     Pulses: Normal pulses.  Pulmonary:     Effort: Pulmonary effort is normal.  Musculoskeletal:        General: Normal range of motion.     Cervical back: Normal range of motion.  Skin:    General: Skin is warm.  Neurological:     General: No focal deficit present.     Mental Status: He is alert and oriented to person, place, and time.  Psychiatric:        Mood and Affect: Mood normal.        Behavior: Behavior  normal.    Review of Systems  Constitutional:  Negative for chills and fever.  HENT:  Negative for hearing loss.   Eyes:  Negative for blurred vision.  Respiratory:  Negative for cough, shortness of breath and wheezing.   Cardiovascular:  Negative for chest pain and palpitations.  Gastrointestinal:  Negative for abdominal pain, heartburn, nausea and vomiting.  Genitourinary:  Negative for dysuria and urgency.  Musculoskeletal:  Negative for myalgias.  Skin:  Negative for itching and rash.  Neurological:  Negative for dizziness, tingling and headaches.  Endo/Heme/Allergies:        See allergy listing  Psychiatric/Behavioral:  Positive for depression and substance abuse. Negative for hallucinations, memory loss and suicidal ideas. The patient is nervous/anxious and has insomnia.    Blood pressure 123/67, pulse 66, temperature 98.2 F (36.8 C), temperature source Oral, resp. rate 16, height 6\' 2"  (1.88 m), weight 119.7 kg, SpO2 100 %. Body mass index is 33.9 kg/m.   Treatment Plan Summary: Daily contact with patient to assess and evaluate symptoms and progress in treatment and Medication management   Safety and Monitoring: Voluntary admission to inpatient psychiatric unit for safety, stabilization and treatment Daily contact with patient to assess and evaluate symptoms and progress in treatment Patient's case to be discussed in multi-disciplinary team meeting Observation Level : q15 minute checks Vital signs: q12 hours Precautions: Safety  Long Term Goal(s): Improvement in symptoms so as ready for discharge   Short Term Goals: Ability to identify changes in lifestyle to reduce recurrence of condition will improve, Ability to verbalize feelings will improve, Ability to disclose and discuss suicidal ideas, Ability to demonstrate self-control will improve, Ability to identify and develop effective coping behaviors will improve, Ability to maintain clinical measurements within normal  limits will improve, Compliance with prescribed medications will improve, and Ability to identify triggers associated with substance abuse/mental health issues will improve   Diagnoses Principal Problem:   MDD (major depressive disorder), recurrent severe, without psychosis (HCC) Active Problems:   Intermittent explosive disorder   GAD (generalized anxiety disorder)   Delta-9-tetrahydrocannabinol (THC) dependence (HCC)   Insomnia   Medications: -Start Gabapentin 200 mg TID for GAD  -Continue Seroquel 100 mg nightly for mood stabilization and sleep (started by admitting provider) -Continue Inderal 10 mg TID for anxiety/tachycardia -Continue Seroquel 25 mg twice daily for anxiety -Taper off Tegretol: Give 100 mg x 3 doses, then stop. -Continue Sertraline 50 mg daily for depressive symptoms     PRNS -Continue hydroxyzine 25 mg 3 times daily as needed for anxiety -Continue Trazodone 100 mg nightly as needed for sleep -Continue Clonidine 0.1 mg as needed for SBP over 160 or DBP over 100 -Continue agitation protocol medications: Haldol/Benadryl/Ativan 2 mg 3 times daily as needed for agitation -Continue Tylenol 650 mg every 6 hours PRN for mild pain -Continue Maalox 30 mg every 4 hrs PRN for indigestion -Continue Milk of Magnesia as needed every 6 hrs for constipation.   Patient has been educated on the benefits, rationales, and possible side effects of all medications as listed above, verbalizes understanding, and is agreeable to trials.   Labs reviewed: Tox screen positive for THC, reviewed results for hemoglobin A1c, TSH, lipid panel, CBC, and CMP.   Discharge Planning: Social work and case management to assist with discharge planning and identification of hospital follow-up needs prior to discharge Estimated LOS: 5-7 days Discharge Concerns: Need to establish a safety plan; Medication compliance and effectiveness Discharge Goals: Return home with outpatient referrals for mental  health follow-up including medication management/psychotherapy   I certify that inpatient services furnished can reasonably be expected to improve the patient's condition.    Starleen Blue, NP 08/03/2022, 2:44 PMPatient ID: Nicholas Rollins, male   DOB: December 01, 1995, 27 y.o.   MRN: [id number]

## 2022-08-03 NOTE — Group Note (Signed)
Recreation Therapy Group Note   Group Topic:Problem Solving  Group Date: 08/03/2022 Start Time: 0935 End Time: 1010 Facilitators: Maysen Sudol-McCall, LRT,CTRS Location: [address] Dayroom   Goal Area(s) Addresses:  Patient will effectively work with peer towards shared goal.  Patient will identify skills used to make activity successful.  Patient will identify how skills used during activity can be used to reach post d/c goals.   Group Description: Landing Pad. In teams of 3-5, patients were given 12 plastic drinking straws and an equal length of masking tape. Using the materials provided, patients were asked to build a landing pad to catch a golf ball dropped from approximately 5 feet in the air. All materials were required to be used by the team in their design. LRT facilitated post-activity discussion.   Affect/Mood: Appropriate   Participation Level: Engaged   Participation Quality: Independent   Behavior: Appropriate   Speech/Thought Process: Focused   Insight: Good   Judgement: Good   Modes of Intervention: STEM Activity   Patient Response to Interventions:  Engaged   Education Outcome:  Acknowledges education   Clinical Observations/Individualized Feedback: Pt attended and participated in group session. Pt worked well with peers in completing the activity.     Plan: Continue to engage patient in RT group sessions 2-3x/week.   Toree Edling-McCall, LRT,CTRS 08/03/2022 12:06 PM

## 2022-08-03 NOTE — [facility] Group Notes (Signed)
Adult Psychoeducational Group Note  Date:  08/03/2022 Time:  10:15 PM  Group Topic/Focus:  Wrap-Up Group:   The focus of this group is to help patients review their daily goal of treatment and discuss progress on daily workbooks.  Participation Level:  Active  Participation Quality:  Attentive  Affect:  Appropriate  Cognitive:  Alert  Insight: Good  Engagement in Group:  Engaged  Modes of Intervention:  Discussion and Support  Additional Comments:  Pt attended AA group.  Maura Crandall Cassandra 08/03/2022, 10:15 PM

## 2022-08-03 NOTE — BH IP Treatment Plan (Signed)
Interdisciplinary Treatment and Diagnostic Plan Update  08/03/2022 Time of Session: 11:20am Nicholas Rollins MRN: [id number]  Principal Diagnosis: MDD (major depressive disorder), recurrent severe, without psychosis (HCC)  Secondary Diagnoses: Principal Problem:   MDD (major depressive disorder), recurrent severe, without psychosis (HCC) Active Problems:   Intermittent explosive disorder   GAD (generalized anxiety disorder)   Delta-9-tetrahydrocannabinol (THC) dependence (HCC)   Insomnia   Current Medications:  Current Facility-Administered Medications  Medication Dose Route Frequency Provider Last Rate Last Admin   acetaminophen (TYLENOL) tablet 650 mg  650 mg Oral Q6H PRN White, Patrice L, NP       alum & mag hydroxide-simeth (MAALOX/MYLANTA) 200-200-20 MG/5ML suspension 30 mL  30 mL Oral Q4H PRN White, Patrice L, NP       carbamazepine (TEGRETOL) chewable tablet 100 mg  100 mg Oral Q12H Massengill, Nathan, MD       cloNIDine (CATAPRES) tablet 0.1 mg  0.1 mg Oral BID PRN Starleen Blue, NP       diphenhydrAMINE (BENADRYL) capsule 50 mg  50 mg Oral TID PRN White, Patrice L, NP       Or   diphenhydrAMINE (BENADRYL) injection 50 mg  50 mg Intramuscular TID PRN White, Patrice L, NP       gabapentin (NEURONTIN) capsule 200 mg  200 mg Oral Q8H Massengill, Nathan, MD   200 mg at 08/03/22 1500   haloperidol (HALDOL) tablet 5 mg  5 mg Oral TID PRN White, Patrice L, NP       Or   haloperidol lactate (HALDOL) injection 5 mg  5 mg Intramuscular TID PRN White, Patrice L, NP       hydrOXYzine (ATARAX) tablet 25 mg  25 mg Oral TID PRN White, Patrice L, NP   25 mg at 08/02/22 2122   LORazepam (ATIVAN) tablet 2 mg  2 mg Oral TID PRN White, Patrice L, NP       Or   LORazepam (ATIVAN) injection 2 mg  2 mg Intramuscular TID PRN White, Patrice L, NP       magnesium hydroxide (MILK OF MAGNESIA) suspension 30 mL  30 mL Oral Daily PRN White, Patrice L, NP       nicotine (NICODERM CQ - dosed in mg/24  hours) patch 21 mg  21 mg Transdermal Daily White, Patrice L, NP   21 mg at 08/03/22 0748   propranolol (INDERAL) tablet 10 mg  10 mg Oral TID Starleen Blue, NP   10 mg at 08/03/22 1500   QUEtiapine (SEROQUEL) tablet 100 mg  100 mg Oral QHS Nkwenti, Doris, NP   100 mg at 08/02/22 2122   QUEtiapine (SEROQUEL) tablet 25 mg  25 mg Oral BID Starleen Blue, NP   25 mg at 08/03/22 1500   sertraline (ZOLOFT) tablet 50 mg  50 mg Oral Daily White, Patrice L, NP   50 mg at 08/03/22 0747   traZODone (DESYREL) tablet 100 mg  100 mg Oral QHS PRN Starleen Blue, NP       PTA Medications: Medications Prior to Admission  Medication Sig Dispense Refill Last Dose   esomeprazole (NEXIUM) 40 MG capsule Take 40 mg by mouth daily.      QUEtiapine (SEROQUEL) 50 MG tablet Take 1 tablet (50 mg total) by mouth at bedtime.      sertraline (ZOLOFT) 50 MG tablet Take 1 tablet (50 mg total) by mouth daily.      zonisamide (ZONEGRAN) 50 MG capsule Take 50-250 mg by mouth at  bedtime. Start by taking one capsule at bedtime, then increase dose weekly as directed up to 5 capsules nightly at bedtime.       Patient Stressors: Financial difficulties   Occupational concerns   Traumatic event    Patient Strengths: Capable of independent living  Communication skills  Supportive family/friends  Work skills   Treatment Modalities: Medication Management, Group therapy, Case management,  1 to 1 session with clinician, Psychoeducation, Recreational therapy.   Physician Treatment Plan for Primary Diagnosis: MDD (major depressive disorder), recurrent severe, without psychosis (HCC) Long Term Goal(s): Improvement in symptoms so as ready for discharge   Short Term Goals: Ability to identify changes in lifestyle to reduce recurrence of condition will improve Ability to verbalize feelings will improve Ability to disclose and discuss suicidal ideas Ability to demonstrate self-control will improve Ability to identify and develop  effective coping behaviors will improve Ability to maintain clinical measurements within normal limits will improve Compliance with prescribed medications will improve Ability to identify triggers associated with substance abuse/mental health issues will improve  Medication Management: Evaluate patient's response, side effects, and tolerance of medication regimen.  Therapeutic Interventions: 1 to 1 sessions, Unit Group sessions and Medication administration.  Evaluation of Outcomes: Progressing  Physician Treatment Plan for Secondary Diagnosis: Principal Problem:   MDD (major depressive disorder), recurrent severe, without psychosis (HCC) Active Problems:   Intermittent explosive disorder   GAD (generalized anxiety disorder)   Delta-9-tetrahydrocannabinol (THC) dependence (HCC)   Insomnia  Long Term Goal(s): Improvement in symptoms so as ready for discharge   Short Term Goals: Ability to identify changes in lifestyle to reduce recurrence of condition will improve Ability to verbalize feelings will improve Ability to disclose and discuss suicidal ideas Ability to demonstrate self-control will improve Ability to identify and develop effective coping behaviors will improve Ability to maintain clinical measurements within normal limits will improve Compliance with prescribed medications will improve Ability to identify triggers associated with substance abuse/mental health issues will improve     Medication Management: Evaluate patient's response, side effects, and tolerance of medication regimen.  Therapeutic Interventions: 1 to 1 sessions, Unit Group sessions and Medication administration.  Evaluation of Outcomes: Progressing   RN Treatment Plan for Primary Diagnosis: MDD (major depressive disorder), recurrent severe, without psychosis (HCC) Long Term Goal(s): Knowledge of disease and therapeutic regimen to maintain health will improve  Short Term Goals: Ability to remain free  from injury will improve, Ability to verbalize frustration and anger appropriately will improve, Ability to demonstrate self-control, Ability to participate in decision making will improve, Ability to verbalize feelings will improve, Ability to disclose and discuss suicidal ideas, Ability to identify and develop effective coping behaviors will improve, and Compliance with prescribed medications will improve  Medication Management: RN will administer medications as ordered by provider, will assess and evaluate patient's response and provide education to patient for prescribed medication. RN will report any adverse and/or side effects to prescribing provider.  Therapeutic Interventions: 1 on 1 counseling sessions, Psychoeducation, Medication administration, Evaluate responses to treatment, Monitor vital signs and CBGs as ordered, Perform/monitor CIWA, COWS, AIMS and Fall Risk screenings as ordered, Perform wound care treatments as ordered.  Evaluation of Outcomes: Progressing   LCSW Treatment Plan for Primary Diagnosis: MDD (major depressive disorder), recurrent severe, without psychosis (HCC) Long Term Goal(s): Safe transition to appropriate next level of care at discharge, Engage patient in therapeutic group addressing interpersonal concerns.  Short Term Goals: Engage patient in aftercare planning with referrals and resources,  Increase social support, Increase ability to appropriately verbalize feelings, Increase emotional regulation, Facilitate acceptance of mental health diagnosis and concerns, Facilitate patient progression through stages of change regarding substance use diagnoses and concerns, Identify triggers associated with mental health/substance abuse issues, and Increase skills for wellness and recovery  Therapeutic Interventions: Assess for all discharge needs, 1 to 1 time with Social worker, Explore available resources and support systems, Assess for adequacy in community support network,  Educate family and significant other(s) on suicide prevention, Complete Psychosocial Assessment, Interpersonal group therapy.  Evaluation of Outcomes: Progressing   Progress in Treatment: Attending groups: Yes. Participating in groups: Yes. Taking medication as prescribed: Yes. Toleration medication: Yes. Family/Significant other contact made: Yes, individual(s) contacted:  Shimon Kanitz (dad) [phone] Lauraine Rinne (fiance) [phone] Patient understands diagnosis: Yes. Discussing patient identified problems/goals with staff: Yes. Medical problems stabilized or resolved: Yes. Denies suicidal/homicidal ideation: Yes. Issues/concerns per patient self-inventory: No.   New problem(s) identified: No, Describe:  none reported   New Short Term/Long Term Goal(s):   medication stabilization, elimination of SI thoughts, development of comprehensive mental wellness plan.    Patient Goals:  Pt states, "get my meds under control and get anger under control"   Discharge Plan or Barriers:  Patient recently admitted. CSW will continue to follow and assess for appropriate referrals and possible discharge planning.    Reason for Continuation of Hospitalization: Anxiety Depression Medication stabilization  Estimated Length of Stay:  3-5 days   Last 3 [place] Suicide Severity Risk Score: Flowsheet Row ED to Hosp-Admission (Current) [facility] from [facility]30/2022 in [facility] [place]  C-SSRS RISK CATEGORY High Risk Low Risk       Last [facility] [facility] 2/9 Scores:     No data to display          Scribe for Treatment Team: Beatris Si, LCSW 08/03/2022 3:15 PM

## 2022-08-04 NOTE — Progress Notes (Signed)
Adult Psychoeducational Group Note  Date:  08/04/2022 Time:  8:47 PM  Group Topic/Focus:  Wrap-Up Group:   The focus of this group is to help patients review their daily goal of treatment and discuss progress on daily workbooks.  Participation Level:  Active  Participation Quality:  Appropriate  Affect:  Appropriate  Cognitive:  Appropriate  Insight: Appropriate  Engagement in Group:  Engaged  Modes of Intervention:  Discussion  Additional Comments:  Pt. Stated they had a great day, rated day at 10. Pt. Stated getting the news to leave tomorrow was the positive thing that happened today. Pt stated goal for tomorrow is to be discharge.  Joselyn Arrow 08/04/2022, 8:47 PM

## 2022-08-04 NOTE — Group Note (Signed)
Date:  08/04/2022 Time:  12:17 PM  Group Topic/Focus:  Managing Feelings:   The focus of this group is to identify what feelings patients have difficulty handling and develop a plan to handle them in a healthier way upon discharge.    Participation Level:  Active  Participation Quality:  Appropriate  Affect:  Appropriate  Cognitive:  Appropriate  Insight: Appropriate  Engagement in Group:  Engaged  Modes of Intervention:  Discussion, Rapport Building, Socialization, and Support  Additional Comments:    Memory Dance Jozee Hammer 08/04/2022, 12:17 PM

## 2022-08-04 NOTE — Progress Notes (Signed)
D: Pt alert and oriented. Pt rates  and anxiety 6/10.  Pt denies experiencing any SI/HI, or AVH at this time.   A: Scheduled medications administered to pt, per MD orders. Support and encouragement provided. Frequent verbal contact made. Routine safety checks conducted q15 minutes.   R: No adverse drug reactions noted. Pt verbally contracts for safety at this time. Pt complaint with medications and treatment plan. Pt interacts well with others on the unit. Pt remains safe at this time. Will continue to monitor.  08/04/22 0100  Psych Admission Type (Psych Patients Only)  Admission Status Voluntary  Psychosocial Assessment  Patient Complaints None  Eye Contact Fair  Facial Expression Anxious  Affect Appropriate to circumstance  Speech Logical/coherent  Interaction Assertive  Motor Activity Other (Comment) (wdl)  Appearance/Hygiene Unremarkable  Behavior Characteristics Appropriate to situation  Mood Pleasant  Thought Process  Coherency WDL  Content WDL  Delusions None reported or observed  Perception WDL  Hallucination None reported or observed  Judgment WDL  Confusion None  Danger to Self  Current suicidal ideation? Denies  Self-Injurious Behavior No self-injurious ideation or behavior indicators observed or expressed   Agreement Not to Harm Self Yes  Description of Agreement Verbal  Danger to Others  Danger to Others None reported or observed

## 2022-08-04 NOTE — Progress Notes (Signed)
Adult Psychoeducational Group Note  Date:  08/04/2022 Time:  1:39 PM  Group Topic/Focus:  Goals Group:   The focus of this group is to help patients establish daily goals to achieve during treatment and discuss how the patient can incorporate goal setting into their daily lives to aide in recovery. Orientation:   The focus of this group is to educate the patient on the purpose and policies of crisis stabilization and provide a format to answer questions about their admission.  The group details unit policies and expectations of patients while admitted.  Participation Level:  Active  Participation Quality:  Appropriate  Affect:  Appropriate  Cognitive:  Appropriate  Insight: Good  Engagement in Group:  Engaged  Modes of Intervention:  Discussion and Education  Additional Comments:  Pt actively participated in group. Pt stated his goal as developing a treatment plan. Pt was very supportive to other group members and provided constructive feedback   Burnett Sheng 08/04/2022, 1:39 PM

## 2022-08-04 NOTE — [facility] Group Notes (Addendum)
Spiritual care group on grief and loss facilitated by chaplain Dyanne Carrel, [facility]  Group Goal:  Support / Education around grief and loss  Members engage in facilitated group support and psycho-social education.  Group Description:  Following introductions and group rules, group members engaged in facilitated group dialog and support around topic of loss, with particular support around experiences of loss in their lives. Group Identified types of loss (relationships / self / things) and identified patterns, circumstances, and changes that precipitate losses. Reflected on thoughts / feelings around loss, normalized grief responses, and recognized variety in grief experience. Group noted Worden's four tasks of grief in discussion.  Group drew on Adlerian / Rogerian, narrative, MI,  Patient Progress: Nicholas Rollins attended the beginning of group.  His verbal participation was minimal, but he did show engagement.  Partway through group, he experienced seizure-like activity and the medical team was called to attend to him.

## 2022-08-04 NOTE — Progress Notes (Signed)
   08/04/22 0100  Psych Admission Type (Psych Patients Only)  Admission Status Voluntary  Psychosocial Assessment  Patient Complaints None  Eye Contact Fair  Facial Expression Anxious  Affect Appropriate to circumstance  Speech Incoherent  Interaction Attention-seeking  Motor Activity Other (Comment) (wdl)  Appearance/Hygiene Unremarkable  Behavior Characteristics Appropriate to situation  Mood Pleasant  Thought Process  Coherency Incoherent  Content Magical thinking (He says that he can control is dream)  Delusions None reported or observed  Perception WDL  Hallucination None reported or observed  Judgment Poor  Confusion None  Danger to Self  Current suicidal ideation? Denies  Self-Injurious Behavior No self-injurious ideation or behavior indicators observed or expressed   Agreement Not to Harm Self Yes  Description of Agreement Verbal  Danger to Others  Danger to Others None reported or observed   D: Pt alert and oriented. Pt rates  and anxiety 6/10.  Pt denies experiencing any SI/HI, or AVH at this time.   A: Scheduled medications administered to pt, per MD orders. Support and encouragement provided. Frequent verbal contact made. Routine safety checks conducted q15 minutes.   R: No adverse drug reactions noted. Pt verbally contracts for safety at this time. Pt complaint with medications and treatment plan. Pt interacts well with others on the unit. Pt remains safe at this time. Will continue to monitor.

## 2022-08-04 NOTE — Group Note (Signed)
Recreation Therapy Group Note   Group Topic:Animal Assisted Therapy   Group Date: 08/04/2022 Start Time: 0950 End Time: 1030 Facilitators: Corydon Schweiss-McCall, LRT,CTRS Location: [address] Dayroom   Animal-Assisted Activity (AAA) Program Checklist/Progress Notes Patient Eligibility Criteria Checklist & Daily Group note for Rec Tx Intervention  AAA/T Program Assumption of Risk Form signed by Patient/ or Parent Legal Guardian Yes  Patient is free of allergies or severe asthma Yes  Patient reports no fear of animals Yes  Patient reports no history of cruelty to animals Yes  Patient understands his/her participation is voluntary Yes  Patient washes hands before animal contact Yes  Patient washes hands after animal contact Yes   Affect/Mood: Appropriate   Participation Level: Engaged   Participation Quality: Independent   Behavior: Appropriate   Speech/Thought Process: Focused    Clinical Observations/Individualized Feedback:  Patient attended session and interacted appropriately with therapy dog and peers. Patient asked appropriate questions about therapy dog and his training. Patient shared stories about their pets at home with group.    Plan: Continue to engage patient in RT group sessions 2-3x/week.   Amairany Schumpert-McCall, LRT,CTRS 08/04/2022 12:31 PM

## 2022-08-04 NOTE — Progress Notes (Signed)
[facility] MD Progress Note  08/04/2022 2:41 PM Nicholas Rollins  MRN:  [id number]  Principal Problem: MDD (major depressive disorder), recurrent severe, without psychosis (HCC) Diagnosis: Principal Problem:   MDD (major depressive disorder), recurrent severe, without psychosis (HCC) Active Problems:   Intermittent explosive disorder   GAD (generalized anxiety disorder)   Delta-9-tetrahydrocannabinol (THC) dependence (HCC)   Insomnia  Reason for admission:  Nicholas Rollins is a 27 yo Caucasian male with prior mental health history of MDD, borderline personality disorder, intermittent explosive disorder, conversion disorder who presented to the [facility] urgent care on 6/27 with complaints of worsening depressive symptoms along with suicidal ideation with multiple plans for the past few months.  Patient was transferred to this behavioral health Hospital on 6/28 for treatment and stabilization of his mental status.   24 hr chart review: Sleep Hours last night: Reports a good sleep quality last night  Nursing Concerns: No seizure-like activities in past 24 hrs as per nursing reports. Attending unit group sessions and participating appropriately per nursing reports and documentation. Behavioral episodes in the past 24 WUJ:WJXB  Medication Compliance: Compliant  Vital Signs in the past 24 hrs: WNL with exception of low heart rate earlier today morning at 50.  Fluids being encouraged. PRN Medications in the past 24 hrs: None  Patient assessment note: During today's encounter, patient denies SI/HI/AVH, he denies paranoia, and there is no evidence of delusional thinking.  He reports being happy to be back on his medications, reports an improved mood, reports that his tremors have lessened with the addition of gabapentin to his medication regimen.  He reports a good sleep quality last night, reports a good appetite, denies being in any physical pain today, denies any issues with his  bowel movements.  Patient is asking to be discharged, stating that he feels good, but has been educated that it is important for him to be monitored for an additional 24 hours after tapering of Tegretol is completed.  We will plan to discharge patient on 7/4 pending discharge follow-up appointments being completed, and outpatient appointments being completed for continuity of care outside of the hospital.  Last dose of Tegretol will be given tonight.  Patient is tolerating being on other medications as listed below, and we are continuing medications with no changes today.  Total Time spent with patient: 30 minutes  Past Psychiatric History:  Previous Psych Diagnoses: MDD, ODD, intermittent explosive disorder, GAD, borderline personality disorder, conversion disorder. Prior inpatient treatment: Multiple at this hospital with the last 1 being in 2018. Current/prior outpatient treatment: Denies having a current outpatient provider. Prior rehab hx: Denies Psychotherapy hx: Denies History of suicide attempts: Twice as per patient, states he made attempts to cut his neck last week, no marks are visible when patient points to the area.  He also reports that he cut himself in 2017, in both forearms, and took 47 tramadol tablets that belonged to his father.  Patient extend his arms and scars are visible on both. History of homicide or aggression: Aggression as described above via hitting things and self when angry. Psychiatric medication history: Unable to recall most of his medications but medications taking in 2018 as per discharge summary at that time as listed above. Psychiatric medication compliance history: Noncompliant Neuromodulation history: Denies Current Psychiatrist: Denies Current therapist: Denies   Past Medical History:  Past Medical History:  Diagnosis Date   ADHD (attention deficit hyperactivity disorder)    Anxiety    Asthma  Broken ankle 2013   Left ankle   Broken wrist 2012    Left wrist   Cyst of brain 2012   Seen by Dr. Lorenso Courier, cleared for sports, found during work-up for severe headahces.  FOund on either MRI or CT scan.    Cyst of left orbit    Possible cyst of left eye; observed during routine eye exam fall 2013, "right next to nerve", was supposed to get a follow-up 03/2012.   Depression    Fatty liver November 2013   resolved with healthier nutrition and increased physical activity   Fractured rib 2011   Headache(784.0)    history of migraines, resolved this with improved overall health   Obesity    Vision abnormalities    myopia    Past Surgical History:  Procedure Laterality Date   ADENOIDECTOMY     27yo   Family History: History reviewed. No pertinent family history.  Family Psychiatric  History: See H&P  Social History:  Social History   Substance and Sexual Activity  Alcohol Use No     Social History   Substance and Sexual Activity  Drug Use No    Social History   Socioeconomic History   Marital status: Single    Spouse name: Not on file   Number of children: Not on file   Years of education: Not on file   Highest education level: Not on file  Occupational History   Not on file  Tobacco Use   Smoking status: Never   Smokeless tobacco: Never  Vaping Use   Vaping Use: Every day  Substance and Sexual Activity   Alcohol use: No   Drug use: No   Sexual activity: Never  Other Topics Concern   Not on file  Social History Narrative   Not on file   Social Determinants of Health   Financial Resource Strain: Not on file  Food Insecurity: No Food Insecurity (07/31/2022)   Hunger Vital Sign    Worried About Running Out of Food in the Last Year: Never true    Ran Out of Food in the Last Year: Never true  Recent Concern: Food Insecurity - Food Insecurity Present (07/30/2022)   Hunger Vital Sign    Worried About Running Out of Food in the Last Year: Never true    Ran Out of Food in the Last Year: Sometimes true  Transportation  Needs: No Transportation Needs (07/31/2022)   PRAPARE - Administrator, Civil Service (Medical): No    Lack of Transportation (Non-Medical): No  Physical Activity: Not on file  Stress: Not on file  Social Connections: Not on file   Additional Social History:    Sleep: Good  Appetite:  Good  Current Medications: Current Facility-Administered Medications  Medication Dose Route Frequency Provider Last Rate Last Admin   acetaminophen (TYLENOL) tablet 650 mg  650 mg Oral Q6H PRN White, Patrice L, NP       alum & mag hydroxide-simeth (MAALOX/MYLANTA) 200-200-20 MG/5ML suspension 30 mL  30 mL Oral Q4H PRN White, Patrice L, NP       carbamazepine (TEGRETOL) chewable tablet 100 mg  100 mg Oral Q12H Massengill, Nathan, MD   100 mg at 08/04/22 1610   cloNIDine (CATAPRES) tablet 0.1 mg  0.1 mg Oral BID PRN Starleen Blue, NP       diphenhydrAMINE (BENADRYL) capsule 50 mg  50 mg Oral TID PRN Layla Barter, NP       Or  diphenhydrAMINE (BENADRYL) injection 50 mg  50 mg Intramuscular TID PRN White, Patrice L, NP       gabapentin (NEURONTIN) capsule 200 mg  200 mg Oral Q8H Massengill, Nathan, MD   200 mg at 08/04/22 1406   haloperidol (HALDOL) tablet 5 mg  5 mg Oral TID PRN White, Patrice L, NP       Or   haloperidol lactate (HALDOL) injection 5 mg  5 mg Intramuscular TID PRN White, Patrice L, NP       hydrOXYzine (ATARAX) tablet 25 mg  25 mg Oral TID PRN White, Patrice L, NP   25 mg at 08/02/22 2122   LORazepam (ATIVAN) tablet 2 mg  2 mg Oral TID PRN White, Patrice L, NP       Or   LORazepam (ATIVAN) injection 2 mg  2 mg Intramuscular TID PRN White, Patrice L, NP       magnesium hydroxide (MILK OF MAGNESIA) suspension 30 mL  30 mL Oral Daily PRN White, Patrice L, NP       nicotine (NICODERM CQ - dosed in mg/24 hours) patch 21 mg  21 mg Transdermal Daily White, Patrice L, NP   21 mg at 08/04/22 9811   propranolol (INDERAL) tablet 10 mg  10 mg Oral TID Starleen Blue, NP   10 mg at  08/04/22 1406   QUEtiapine (SEROQUEL) tablet 100 mg  100 mg Oral QHS Brandolyn Shortridge, NP   100 mg at 08/03/22 2118   QUEtiapine (SEROQUEL) tablet 25 mg  25 mg Oral BID Starleen Blue, NP   25 mg at 08/04/22 1405   sertraline (ZOLOFT) tablet 50 mg  50 mg Oral Daily White, Patrice L, NP   50 mg at 08/04/22 9147   traZODone (DESYREL) tablet 100 mg  100 mg Oral QHS PRN Starleen Blue, NP       Lab Results:  Results for orders placed or performed during the hospital encounter of 07/31/22 (from the past 48 hour(s))  Glucose, capillary     Status: None   Collection Time: 08/03/22  1:31 PM  Result Value Ref Range   Glucose-Capillary 85 70 - 99 mg/dL    Comment: Glucose reference range applies only to samples taken after fasting for at least 8 hours.   Blood Alcohol level:  Lab Results  Component Value Date   ETH <10 07/30/2022   ETH <5 10/06/2016    Metabolic Disorder Labs: Lab Results  Component Value Date   HGBA1C 5.0 07/30/2022   MPG 97 07/30/2022   MPG 96.8 10/08/2016   Lab Results  Component Value Date   PROLACTIN 9.5 07/30/2022   PROLACTIN 22.7 (H) 10/12/2016   Lab Results  Component Value Date   CHOL 153 07/30/2022   TRIG 78 07/30/2022   HDL 54 07/30/2022   CHOLHDL 2.8 07/30/2022   VLDL 16 07/30/2022   LDLCALC 83 07/30/2022   LDLCALC 78 10/08/2016   Physical Findings: AIMS:  , ,  ,  ,    CIWA:    COWS:     Musculoskeletal: Strength & Muscle Tone: within normal limits Gait & Station: normal Patient leans: N/A  Psychiatric Specialty Exam:  Presentation  General Appearance:  Appropriate for Environment; Well Groomed  Eye Contact: Good  Speech: Clear and Coherent  Speech Volume: Normal  Handedness: Right  Mood and Affect  Mood: Anxious  Affect: Congruent  Thought Process  Thought Processes: Coherent  Descriptions of Associations:Intact  Orientation:Full (Time, Place and Person)  Thought  Content:WDL  History of  Schizophrenia/Schizoaffective disorder:No data recorded Duration of Psychotic Symptoms:No data recorded Hallucinations:Hallucinations: None  Ideas of Reference:None  Suicidal Thoughts:Suicidal Thoughts: No  Homicidal Thoughts:Homicidal Thoughts: No  Sensorium  Memory: Immediate Good  Judgment: Fair  Insight: Fair  Executive Functions  Concentration: Good  Attention Span: Good  Recall: Fair  Fund of Knowledge: Fair  Language: Good  Psychomotor Activity  Psychomotor Activity: Psychomotor Activity: Normal  Assets  Assets: Social Support; Resilience; Housing  Sleep  Sleep: Sleep: Good  Physical Exam: Physical Exam Vitals and nursing note reviewed.  HENT:     Head: Normocephalic.     Nose: Nose normal.     Mouth/Throat:     Mouth: Mucous membranes are moist.     Pharynx: Oropharynx is clear.  Eyes:     Extraocular Movements: Extraocular movements intact.     Pupils: Pupils are equal, round, and reactive to light.  Cardiovascular:     Rate and Rhythm: Normal rate.     Pulses: Normal pulses.  Pulmonary:     Effort: Pulmonary effort is normal.  Musculoskeletal:        General: Normal range of motion.     Cervical back: Normal range of motion.  Skin:    General: Skin is warm.  Neurological:     General: No focal deficit present.     Mental Status: He is alert and oriented to person, place, and time.  Psychiatric:        Mood and Affect: Mood normal.        Behavior: Behavior normal.    Review of Systems  Constitutional:  Negative for chills and fever.  HENT:  Negative for hearing loss.   Eyes:  Negative for blurred vision.  Respiratory:  Negative for cough, shortness of breath and wheezing.   Cardiovascular:  Negative for chest pain and palpitations.  Gastrointestinal:  Negative for abdominal pain, heartburn, nausea and vomiting.  Genitourinary:  Negative for dysuria and urgency.  Musculoskeletal:  Negative for myalgias.  Skin:  Negative  for itching and rash.  Neurological:  Negative for dizziness, tingling and headaches.  Endo/Heme/Allergies:        See allergy listing  Psychiatric/Behavioral:  Positive for depression and substance abuse. Negative for hallucinations, memory loss and suicidal ideas. The patient is nervous/anxious and has insomnia.    Blood pressure 119/80, pulse 63, temperature 98 F (36.7 C), resp. rate 14, height 6\' 2"  (1.88 m), weight 119.7 kg, SpO2 100 %. Body mass index is 33.9 kg/m.   Treatment Plan Summary: Daily contact with patient to assess and evaluate symptoms and progress in treatment and Medication management   Safety and Monitoring: Voluntary admission to inpatient psychiatric unit for safety, stabilization and treatment Daily contact with patient to assess and evaluate symptoms and progress in treatment Patient's case to be discussed in multi-disciplinary team meeting Observation Level : q15 minute checks Vital signs: q12 hours Precautions: Safety   Long Term Goal(s): Improvement in symptoms so as ready for discharge   Short Term Goals: Ability to identify changes in lifestyle to reduce recurrence of condition will improve, Ability to verbalize feelings will improve, Ability to disclose and discuss suicidal ideas, Ability to demonstrate self-control will improve, Ability to identify and develop effective coping behaviors will improve, Ability to maintain clinical measurements within normal limits will improve, Compliance with prescribed medications will improve, and Ability to identify triggers associated with substance abuse/mental health issues will improve   Diagnoses Principal Problem:  MDD (major depressive disorder), recurrent severe, without psychosis (HCC) Active Problems:   Intermittent explosive disorder   GAD (generalized anxiety disorder)   Delta-9-tetrahydrocannabinol (THC) dependence (HCC)   Insomnia   Medications: -Continue Gabapentin 200 mg TID for GAD  -Continue  Seroquel 100 mg nightly for mood stabilization and sleep (started by admitting provider) -Continue Inderal 10 mg TID for anxiety/tachycardia -Continue Seroquel 25 mg twice daily for anxiety -Taper off Tegretol: Give 100 mg x 3 doses, then stop. Last dose on 7/2 @ 20.00. -Continue Sertraline 50 mg daily for depressive symptoms     PRNS -Continue hydroxyzine 25 mg 3 times daily as needed for anxiety -Continue Trazodone 100 mg nightly as needed for sleep -Continue Clonidine 0.1 mg as needed for SBP over 160 or DBP over 100 -Continue agitation protocol medications: Haldol/Benadryl/Ativan 2 mg 3 times daily as needed for agitation -Continue Tylenol 650 mg every 6 hours PRN for mild pain -Continue Maalox 30 mg every 4 hrs PRN for indigestion -Continue Milk of Magnesia as needed every 6 hrs for constipation.   Patient has been educated on the benefits, rationales, and possible side effects of all medications as listed above, verbalizes understanding, and is agreeable to trials.   Labs reviewed: Tox screen positive for THC, reviewed results for hemoglobin A1c, TSH, lipid panel, CBC, and CMP.   Discharge Planning: Social work and case management to assist with discharge planning and identification of hospital follow-up needs prior to discharge Estimated LOS: 5-7 days Discharge Concerns: Need to establish a safety plan; Medication compliance and effectiveness Discharge Goals: Return home with outpatient referrals for mental health follow-up including medication management/psychotherapy   I certify that inpatient services furnished can reasonably be expected to improve the patient's condition.    Starleen Blue, NP 08/04/2022, 2:41 PMPatient ID: Nicholas Rollins, male   DOB: 02-07-1995, 27 y.o.   MRN: [id number] Patient ID: Nicholas Rollins, male   DOB: 1996-01-05, 27 y.o.   MRN: [id number]

## 2022-08-05 NOTE — Progress Notes (Signed)
Pt resting in bed respirations even and unlabored.

## 2022-08-05 NOTE — Progress Notes (Signed)
Was just notified by charge nurse Silvio Pate that pt experienced a pseudoseizure. Went to pt room and noted pt is lying on bed alert and oriented x 4. Pt denies loss of consciousness and denies hitting head. Dorris, nurse practitioner made aware. Continuing observation of pt at this time and vitals in progress.

## 2022-08-05 NOTE — Group Note (Signed)
Recreation Therapy Group Note   Group Topic:Problem Solving  Group Date: 08/05/2022 Start Time: 0930 End Time: 1002 Facilitators: Mayanna Garlitz-McCall, LRT,CTRS Location: [address] Dayroom   Goal Area(s) Addresses:  Patient will effectively work in a team with other group members. Patient will verbalize importance of using appropriate problem solving techniques.  Patient will identify positive change associated with effective problem solving skills.   Group Description: Brain Teasers.  Patients were given two sheets of brain teasers. Patients were to complete the teasers to the best of their ability. Once completed, LRT and patients reviewed the answers. Patients would add all the questions they got correct, the person with the most correct answers got a prize. If there was a tie, LRT would ask a tie breaker question.    Affect/Mood: Appropriate   Participation Level: Engaged   Participation Quality: Independent   Behavior: Appropriate   Speech/Thought Process: Focused   Insight: Good   Judgement: Good   Modes of Intervention: Problem-solving   Patient Response to Interventions:  Engaged   Education Outcome:  Acknowledges education   Clinical Observations/Individualized Feedback: Pt was engaging and social with peers. Pt was focused on completing the activity as well.     Plan: Continue to engage patient in RT group sessions 2-3x/week.   Carlyon Nolasco-McCall, LRT,CTRS 08/05/2022 1:01 PM

## 2022-08-05 NOTE — [facility] Group Notes (Signed)
[facility] Group Notes:  (Nursing/MHT/Case Management/Adjunct)  Date:  08/05/2022  Time:  8:15 PM  Type of Therapy:   NA group  Participation Level:  Active  Participation Quality:  Appropriate  Affect:  Appropriate  Cognitive:  Appropriate  Insight:  Appropriate  Engagement in Group:  Engaged  Modes of Intervention:  Education  Summary of Progress/Problems: Attended NA meeting.  Noah Delaine 08/05/2022, 8:15 PM

## 2022-08-05 NOTE — Group Note (Signed)
Date:  08/05/2022 Time:  7:07 PM  Group Topic/Focus:  Social Wellness ( Self and Interpersonal)    Participation Level:  Active  Participation Quality:  Appropriate  Affect:  Appropriate  Cognitive:  Appropriate  Insight: Appropriate  Engagement in Group:  Engaged  Modes of Intervention:  Activity and Socialization  Additional Comments:   Pt attended and actively participated in the Social Wellness group.  Nicholas Rollins Nicholas Rollins 08/05/2022, 7:07 PM

## 2022-08-05 NOTE — Group Note (Signed)
Date:  08/05/2022 Time:  6:47 PM  Group Topic/Focus:  Goals Group:   The focus of this group is to help patients establish daily goals to achieve during treatment and discuss how the patient can incorporate goal setting into their daily lives to aide in recovery. Orientation:   The focus of this group is to educate the patient on the purpose and policies of crisis stabilization and provide a format to answer questions about their admission.  The group details unit policies and expectations of patients while admitted.    Participation Level:  Active  Participation Quality:  Appropriate  Affect:  Appropriate  Cognitive:  Appropriate  Insight: Appropriate  Engagement in Group:  Engaged  Modes of Intervention:  Activity, Orientation, and Rapport Building  Additional Comments:   Pt attended and participated in the Orientation/Goals group. Pt denied SI/HI/AVH,."I feel real good and I should be getting discharged today or tomorrow".  Pt completed the daily goal activity identifying discharge from treatment as a personal goal. Stark Bray 08/05/2022, 6:47 PM

## 2022-08-05 NOTE — Progress Notes (Signed)
[facility] MD Progress Note  08/05/2022 1:41 PM Nicholas Rollins  MRN:  [id number]  Principal Problem: MDD (major depressive disorder), recurrent severe, without psychosis (HCC) Diagnosis: Principal Problem:   MDD (major depressive disorder), recurrent severe, without psychosis (HCC) Active Problems:   Intermittent explosive disorder   GAD (generalized anxiety disorder)   Delta-9-tetrahydrocannabinol (THC) dependence (HCC)   Insomnia  Reason for admission:  Nicholas Rollins is a 27 yo Caucasian male with prior mental health history of MDD, borderline personality disorder, intermittent explosive disorder, conversion disorder who presented to the [facility] urgent care on 6/27 with complaints of worsening depressive symptoms along with suicidal ideation with multiple plans for the past few months.  Patient was transferred to this behavioral health Hospital on 6/28 for treatment and stabilization of his mental status.   24 hr chart review: Sleep Hours last night: Not documented, pt reports a good sleep quality last night  Nursing Concerns: Had rhythmic body movements along with complaints of hot flashes while in the cafeteria today afternoon requiring transportation back to the unit in a wheelchair.  Behavioral episodes in the past 24 WUJ:WJXB  Medication Compliance: Compliant  Vital Signs in the past 24 hrs: WNL  PRN Medications in the past 24 hrs: None  Patient assessment note: Pt presents today with a significantly less depressed and less anxious mood as compared to admission. His attention to personal hygiene and grooming is fair, eye contact is good, speech is clear & coherent. Thought contents are organized and logical, and pt currently denies SI/HI/AVH or paranoia. There is no evidence of delusional thoughts.    Pt reports that his sleep quality last night was good, and he reports a good appetite, denies being in any physical pain. He reports that he is tolerating  current medication regimen well, reports that last night, he had "hot flashes which lasted a while, then I was fine. It might have been because I am coming off the Tegretol." Pt educated that it is unlikely that the Tegretol caused the hot flashes since he has only been on it for a few days prior to the taper.   Pt had another episode in the day room today during lunch as noted above where he was oriented the entire time, had rhythmic movements of extremities, complained of hot flashes and vitals were WNL. Pt ate lunch and is currently in bed resting with no signs of distress. We will plan to discharge patient tomorrow, 7/4 and continuing all medications as listed below with no changes today.  Total Time spent with patient: 30 minutes  Past Psychiatric History:  Previous Psych Diagnoses: MDD, ODD, intermittent explosive disorder, GAD, borderline personality disorder, conversion disorder. Prior inpatient treatment: Multiple at this hospital with the last 1 being in 2018. Current/prior outpatient treatment: Denies having a current outpatient provider. Prior rehab hx: Denies Psychotherapy hx: Denies History of suicide attempts: Twice as per patient, states he made attempts to cut his neck last week, no marks are visible when patient points to the area.  He also reports that he cut himself in 2017, in both forearms, and took 47 tramadol tablets that belonged to his father.  Patient extend his arms and scars are visible on both. History of homicide or aggression: Aggression as described above via hitting things and self when angry. Psychiatric medication history: Unable to recall most of his medications but medications taking in 2018 as per discharge summary at that time as listed above. Psychiatric medication compliance  history: Noncompliant Neuromodulation history: Denies Current Psychiatrist: Denies Current therapist: Denies   Past Medical History:  Past Medical History:  Diagnosis Date   ADHD  (attention deficit hyperactivity disorder)    Anxiety    Asthma    Broken ankle 2013   Left ankle   Broken wrist 2012   Left wrist   Cyst of brain 2012   Seen by Dr. Lorenso Courier, cleared for sports, found during work-up for severe headahces.  FOund on either MRI or CT scan.    Cyst of left orbit    Possible cyst of left eye; observed during routine eye exam fall 2013, "right next to nerve", was supposed to get a follow-up 03/2012.   Depression    Fatty liver November 2013   resolved with healthier nutrition and increased physical activity   Fractured rib 2011   Headache(784.0)    history of migraines, resolved this with improved overall health   Obesity    Vision abnormalities    myopia    Past Surgical History:  Procedure Laterality Date   ADENOIDECTOMY     27yo   Family History: History reviewed. No pertinent family history.  Family Psychiatric  History: See H&P  Social History:  Social History   Substance and Sexual Activity  Alcohol Use No     Social History   Substance and Sexual Activity  Drug Use No    Social History   Socioeconomic History   Marital status: Single    Spouse name: Not on file   Number of children: Not on file   Years of education: Not on file   Highest education level: Not on file  Occupational History   Not on file  Tobacco Use   Smoking status: Never   Smokeless tobacco: Never  Vaping Use   Vaping Use: Every day  Substance and Sexual Activity   Alcohol use: No   Drug use: No   Sexual activity: Never  Other Topics Concern   Not on file  Social History Narrative   Not on file   Social Determinants of Health   Financial Resource Strain: Not on file  Food Insecurity: No Food Insecurity (07/31/2022)   Hunger Vital Sign    Worried About Running Out of Food in the Last Year: Never true    Ran Out of Food in the Last Year: Never true  Recent Concern: Food Insecurity - Food Insecurity Present (07/30/2022)   Hunger Vital Sign     Worried About Running Out of Food in the Last Year: Never true    Ran Out of Food in the Last Year: Sometimes true  Transportation Needs: No Transportation Needs (07/31/2022)   PRAPARE - Administrator, Civil Service (Medical): No    Lack of Transportation (Non-Medical): No  Physical Activity: Not on file  Stress: Not on file  Social Connections: Not on file   Additional Social History:    Sleep: Good  Appetite:  Good  Current Medications: Current Facility-Administered Medications  Medication Dose Route Frequency Provider Last Rate Last Admin   acetaminophen (TYLENOL) tablet 650 mg  650 mg Oral Q6H PRN White, Patrice L, NP       alum & mag hydroxide-simeth (MAALOX/MYLANTA) 200-200-20 MG/5ML suspension 30 mL  30 mL Oral Q4H PRN White, Patrice L, NP       cloNIDine (CATAPRES) tablet 0.1 mg  0.1 mg Oral BID PRN Starleen Blue, NP       diphenhydrAMINE (BENADRYL) capsule 50 mg  50 mg Oral TID PRN White, Patrice L, NP       Or   diphenhydrAMINE (BENADRYL) injection 50 mg  50 mg Intramuscular TID PRN White, Patrice L, NP       gabapentin (NEURONTIN) capsule 200 mg  200 mg Oral Q8H Massengill, Nathan, MD   200 mg at 08/05/22 8295   haloperidol (HALDOL) tablet 5 mg  5 mg Oral TID PRN White, Patrice L, NP       Or   haloperidol lactate (HALDOL) injection 5 mg  5 mg Intramuscular TID PRN White, Patrice L, NP       hydrOXYzine (ATARAX) tablet 25 mg  25 mg Oral TID PRN White, Patrice L, NP   25 mg at 08/02/22 2122   LORazepam (ATIVAN) tablet 2 mg  2 mg Oral TID PRN White, Patrice L, NP       Or   LORazepam (ATIVAN) injection 2 mg  2 mg Intramuscular TID PRN White, Patrice L, NP       magnesium hydroxide (MILK OF MAGNESIA) suspension 30 mL  30 mL Oral Daily PRN White, Patrice L, NP       nicotine (NICODERM CQ - dosed in mg/24 hours) patch 21 mg  21 mg Transdermal Daily White, Patrice L, NP   21 mg at 08/05/22 0748   propranolol (INDERAL) tablet 10 mg  10 mg Oral TID Starleen Blue, NP    10 mg at 08/05/22 0748   QUEtiapine (SEROQUEL) tablet 100 mg  100 mg Oral QHS Bexton Haak, NP   100 mg at 08/04/22 2041   QUEtiapine (SEROQUEL) tablet 25 mg  25 mg Oral BID Starleen Blue, NP   25 mg at 08/05/22 0748   sertraline (ZOLOFT) tablet 50 mg  50 mg Oral Daily White, Patrice L, NP   50 mg at 08/05/22 0748   traZODone (DESYREL) tablet 100 mg  100 mg Oral QHS PRN Starleen Blue, NP       Lab Results:  No results found for this or any previous visit (from the past 48 hour(s)).  Blood Alcohol level:  Lab Results  Component Value Date   ETH <10 07/30/2022   ETH <5 10/06/2016    Metabolic Disorder Labs: Lab Results  Component Value Date   HGBA1C 5.0 07/30/2022   MPG 97 07/30/2022   MPG 96.8 10/08/2016   Lab Results  Component Value Date   PROLACTIN 9.5 07/30/2022   PROLACTIN 22.7 (H) 10/12/2016   Lab Results  Component Value Date   CHOL 153 07/30/2022   TRIG 78 07/30/2022   HDL 54 07/30/2022   CHOLHDL 2.8 07/30/2022   VLDL 16 07/30/2022   LDLCALC 83 07/30/2022   LDLCALC 78 10/08/2016   Physical Findings: AIMS:  , ,  ,  ,    CIWA:    COWS:     Musculoskeletal: Strength & Muscle Tone: within normal limits Gait & Station: normal Patient leans: N/A  Psychiatric Specialty Exam:  Presentation  General Appearance:  Appropriate for Environment  Eye Contact: Good  Speech: Clear and Coherent  Speech Volume: Normal  Handedness: Right  Mood and Affect  Mood: Anxious  Affect: Congruent  Thought Process  Thought Processes: Coherent  Descriptions of Associations:Intact  Orientation:Full (Time, Place and Person)  Thought Content:Logical  History of Schizophrenia/Schizoaffective disorder:No data recorded Duration of Psychotic Symptoms:No data recorded Hallucinations:Hallucinations: None  Ideas of Reference:None  Suicidal Thoughts:Suicidal Thoughts: No  Homicidal Thoughts:Homicidal Thoughts: No  Sensorium  Memory: Immediate  Good  Judgment: Fair  Insight: Fair  Chartered certified accountant: Fair  Attention Span: Fair  Recall: Jennelle Human of Knowledge: Fair  Language: Fair  Psychomotor Activity  Psychomotor Activity: Psychomotor Activity: Normal  Assets  Assets: Resilience; Social Support  Sleep  Sleep: Sleep: Good  Physical Exam: Physical Exam Vitals and nursing note reviewed.  HENT:     Head: Normocephalic.     Nose: Nose normal.     Mouth/Throat:     Mouth: Mucous membranes are moist.     Pharynx: Oropharynx is clear.  Eyes:     Extraocular Movements: Extraocular movements intact.     Pupils: Pupils are equal, round, and reactive to light.  Cardiovascular:     Rate and Rhythm: Normal rate.     Pulses: Normal pulses.  Pulmonary:     Effort: Pulmonary effort is normal.  Musculoskeletal:        General: Normal range of motion.     Cervical back: Normal range of motion.  Skin:    General: Skin is warm.  Neurological:     General: No focal deficit present.     Mental Status: He is alert and oriented to person, place, and time.  Psychiatric:        Mood and Affect: Mood normal.        Behavior: Behavior normal.    Review of Systems  Constitutional:  Negative for chills and fever.  HENT:  Negative for hearing loss.   Eyes:  Negative for blurred vision.  Respiratory:  Negative for cough, shortness of breath and wheezing.   Cardiovascular:  Negative for chest pain and palpitations.  Gastrointestinal:  Negative for abdominal pain, heartburn, nausea and vomiting.  Genitourinary:  Negative for dysuria and urgency.  Musculoskeletal:  Negative for myalgias.  Skin:  Negative for itching and rash.  Neurological:  Negative for dizziness, tingling and headaches.  Endo/Heme/Allergies:        See allergy listing  Psychiatric/Behavioral:  Positive for depression and substance abuse. Negative for hallucinations, memory loss and suicidal ideas. The patient is nervous/anxious  and has insomnia.    Blood pressure 117/64, pulse 73, temperature 97.7 F (36.5 C), temperature source Oral, resp. rate 14, height 6\' 2"  (1.88 m), weight 119.7 kg, SpO2 99 %. Body mass index is 33.9 kg/m.   Treatment Plan Summary: Daily contact with patient to assess and evaluate symptoms and progress in treatment and Medication management   Safety and Monitoring: Voluntary admission to inpatient psychiatric unit for safety, stabilization and treatment Daily contact with patient to assess and evaluate symptoms and progress in treatment Patient's case to be discussed in multi-disciplinary team meeting Observation Level : q15 minute checks Vital signs: q12 hours Precautions: Safety   Long Term Goal(s): Improvement in symptoms so as ready for discharge   Short Term Goals: Ability to identify changes in lifestyle to reduce recurrence of condition will improve, Ability to verbalize feelings will improve, Ability to disclose and discuss suicidal ideas, Ability to demonstrate self-control will improve, Ability to identify and develop effective coping behaviors will improve, Ability to maintain clinical measurements within normal limits will improve, Compliance with prescribed medications will improve, and Ability to identify triggers associated with substance abuse/mental health issues will improve   Diagnoses Principal Problem:   MDD (major depressive disorder), recurrent severe, without psychosis (HCC) Active Problems:   Intermittent explosive disorder   GAD (generalized anxiety disorder)   Delta-9-tetrahydrocannabinol (THC) dependence (HCC)   Insomnia   Medications: -Continue Gabapentin  200 mg TID for GAD  -Continue Seroquel 100 mg nightly for mood stabilization and sleep (started by admitting provider) -Continue Inderal 10 mg TID for anxiety/tachycardia -Continue Seroquel 25 mg twice daily for anxiety -Tapered off Tegretol completely: Given 100 mg x 3 doses, then stopped. Last dose  was on 7/2 @ 20.00. -Continue Sertraline 50 mg daily for depressive symptoms     PRNS -Continue hydroxyzine 25 mg 3 times daily as needed for anxiety -Continue Trazodone 100 mg nightly as needed for sleep -Continue Clonidine 0.1 mg as needed for SBP over 160 or DBP over 100 -Continue agitation protocol medications: Haldol/Benadryl/Ativan 2 mg 3 times daily as needed for agitation -Continue Tylenol 650 mg every 6 hours PRN for mild pain -Continue Maalox 30 mg every 4 hrs PRN for indigestion -Continue Milk of Magnesia as needed every 6 hrs for constipation.   Patient has been educated on the benefits, rationales, and possible side effects of all medications as listed above, verbalizes understanding, and is agreeable to trials.   Labs reviewed: Tox screen positive for THC, reviewed results for hemoglobin A1c, TSH, lipid panel, CBC, and CMP.   Discharge Planning: Social work and case management to assist with discharge planning and identification of hospital follow-up needs prior to discharge Estimated LOS: 5-7 days Discharge Concerns: Need to establish a safety plan; Medication compliance and effectiveness Discharge Goals: Return home with outpatient referrals for mental health follow-up including medication management/psychotherapy   I certify that inpatient services furnished can reasonably be expected to improve the patient's condition.    Starleen Blue, NP 08/05/2022, 1:41 PMPatient ID: Nicholas Rollins, male   DOB: 06-20-1995, 27 y.o.   MRN: [id number] Patient ID: Nicholas Rollins, male   DOB: Oct 17, 1995, 27 y.o.

## 2022-08-05 NOTE — [facility] Group Notes (Signed)
Spiritual care group facilitated by Chaplain Katy Aveen Stansel, [facility]  Group focused on topic of strength. Group members reflected on what thoughts and feelings emerge when they hear this topic. They then engaged in facilitated dialog around how strength is present in their lives. This dialog focused on representing what strength had been to them in their lives (images and patterns given) and what they saw as helpful in their life now (what they needed / wanted).  Activity drew on narrative framework.  Patient Progress: Did not attend.  

## 2022-08-05 NOTE — Progress Notes (Signed)
   08/05/22 0100  Psych Admission Type (Psych Patients Only)  Admission Status Voluntary  Psychosocial Assessment  Patient Complaints None  Eye Contact Fair  Facial Expression Animated  Affect Appropriate to circumstance  Speech Logical/coherent  Interaction Assertive  Motor Activity Slow  Appearance/Hygiene Unremarkable  Behavior Characteristics Cooperative;Appropriate to situation  Mood Pleasant  Thought Process  Coherency WDL  Content WDL  Delusions None reported or observed  Perception WDL  Hallucination None reported or observed  Judgment Poor  Confusion None  Danger to Self  Current suicidal ideation? Denies  Self-Injurious Behavior No self-injurious ideation or behavior indicators observed or expressed   Agreement Not to Harm Self Yes  Description of Agreement verbal  Danger to Others  Danger to Others None reported or observed

## 2022-08-05 NOTE — Progress Notes (Signed)
   08/05/22 0748  Psych Admission Type (Psych Patients Only)  Admission Status Voluntary  Psychosocial Assessment  Patient Complaints None  Eye Contact Fair  Facial Expression Animated  Affect Appropriate to circumstance  Speech Logical/coherent  Interaction Assertive  Motor Activity Other (Comment) (unremarkable)  Appearance/Hygiene Unremarkable  Behavior Characteristics Cooperative  Mood Pleasant  Thought Process  Coherency WDL  Content WDL  Delusions None reported or observed  Perception WDL  Hallucination None reported or observed  Judgment WDL  Confusion None  Danger to Self  Current suicidal ideation? Denies  Self-Injurious Behavior No self-injurious ideation or behavior indicators observed or expressed   Agreement Not to Harm Self Yes  Description of Agreement verbal  Danger to Others  Danger to Others None reported or observed

## 2022-08-06 MED ORDER — NICOTINE 21 MG/24HR TD PT24: 21.0000 mg | MEDICATED_PATCH | Freq: Every day | TRANSDERMAL | 0 refills | Status: AC

## 2022-08-06 MED ORDER — GABAPENTIN 100 MG PO CAPS: 200.0000 mg | ORAL_CAPSULE | Freq: Three times a day (TID) | ORAL | 0 refills | Status: AC

## 2022-08-06 MED ORDER — QUETIAPINE FUMARATE 100 MG PO TABS: 100.0000 mg | ORAL_TABLET | Freq: Every day | ORAL | 0 refills | Status: AC

## 2022-08-06 MED ORDER — HYDROXYZINE HCL 25 MG PO TABS: 25.0000 mg | ORAL_TABLET | Freq: Three times a day (TID) | ORAL | 0 refills | Status: AC | PRN

## 2022-08-06 MED ORDER — PROPRANOLOL HCL 10 MG PO TABS: 10.0000 mg | ORAL_TABLET | Freq: Three times a day (TID) | ORAL | 0 refills | Status: AC

## 2022-08-06 MED ORDER — QUETIAPINE FUMARATE 25 MG PO TABS: 25.0000 mg | ORAL_TABLET | Freq: Two times a day (BID) | ORAL | 0 refills | Status: AC

## 2022-08-06 MED ORDER — SERTRALINE HCL 50 MG PO TABS: 50.0000 mg | ORAL_TABLET | Freq: Every day | ORAL | 0 refills | Status: AC

## 2022-08-06 NOTE — Progress Notes (Signed)
  [facility] Adult Case Management Discharge Plan :  Will you be returning to the same living situation after discharge:  Yes,  Beverly Bosio (dad) [phone]  At discharge, do you have transportation home?: Yes,  Father Do you have the ability to pay for your medications: Yes,  Insured  Release of information consent forms completed and in the chart;  Patient's signature needed at discharge.  Patient to Follow up at:  Follow-up Information     [facility] [employer] Go on 08/10/2022.   Why: You have a hospital follow up appointment for therapy and medication management services on 08/10/22 at 8:30 am.  This appointment will be held in person. Contact information: [address] [place] [address] [phone]                 Next level of care provider has access to [facility] Link:no  Safety Planning and Suicide Prevention discussed: Yes,  Damoni Wheelus (dad) [phone]      Has patient been referred to the Quitline?: Patient does not use tobacco/nicotine products  Patient has been referred for addiction treatment: Yes, referral information given but appointment not made [facility] (list facility). Patient to continue working towards treatment goals after discharge. Patient no longer meets criteria for inpatient criteria per attending physician. Continue taking medications as prescribed, nursing to provide instructions at discharge. Follow up with all scheduled appointments.   Atzin Buchta S Brelynn Wheller, LCSW 08/06/2022, 9:26 AM

## 2022-08-06 NOTE — Group Note (Unsigned)
Date:  08/06/2022 Time:  10:04 AM  Group Topic/Focus:  Orientation:   The focus of this group is to educate the patient on the purpose and policies of crisis stabilization and provide a format to answer questions about their admission.  The group details unit policies and expectations of patients while admitted.     Participation Level:  {[facility] PARTICIPATION LEVEL:22264}  Participation Quality:  {[facility] PARTICIPATION QUALITY:22265}  Affect:  {[facility] AFFECT:22266}  Cognitive:  {[facility] COGNITIVE:22267}  Insight: {BHH Insight2:20797}  Engagement in Group:  {[facility] ENGAGEMENT IN GROUP:22268}  Modes of Intervention:  {B[facility] MODES OF INTERVENTION:22269}  Additional Comments:  ***  Zae Kirtz D Quantay Zaremba 08/06/2022, 10:04 AM  

## 2022-08-06 NOTE — Group Note (Signed)
Date:  08/06/2022 Time:  10:08 AM  Group Topic/Focus:  Orientation:   The focus of this group is to educate the patient on the purpose and policies of crisis stabilization and provide a format to answer questions about their admission.  The group details unit policies and expectations of patients while admitted.    Participation Level:  Active  Participation Quality:  Appropriate  Affect:  Appropriate  Cognitive:  Appropriate  Insight: Appropriate  Engagement in Group:  Engaged  Modes of Intervention:  Discussion  Additional Comments:     Reymundo Poll 08/06/2022, 10:08 AM

## 2022-08-06 NOTE — Progress Notes (Signed)
   08/06/22 0532  15 Minute Checks  Location Bedroom  Visual Appearance Calm  Behavior Sleeping  Sleep (Behavioral Health Patients Only)  Calculate sleep? (Click Yes once per 24 hr at 0600 safety check) Yes  Documented sleep last 24 hours 7

## 2022-08-06 NOTE — Progress Notes (Signed)
   08/06/22 1001  Psych Admission Type (Psych Patients Only)  Admission Status Voluntary  Psychosocial Assessment  Patient Complaints None  Eye Contact Fair  Facial Expression Animated  Affect Appropriate to circumstance  Speech Logical/coherent  Interaction Assertive  Motor Activity Other (Comment) (WNL)  Appearance/Hygiene Unremarkable  Behavior Characteristics Cooperative  Mood Pleasant  Aggressive Behavior  Effect No apparent injury  Thought Process  Coherency WDL  Content WDL  Delusions None reported or observed  Perception WDL  Hallucination None reported or observed  Judgment WDL  Confusion None  Danger to Self  Current suicidal ideation? Denies  Self-Injurious Behavior No self-injurious ideation or behavior indicators observed or expressed   Agreement Not to Harm Self Yes  Description of Agreement Verbal  Danger to Others  Danger to Others None reported or observed

## 2022-08-06 NOTE — Progress Notes (Signed)
Patient discharged. Reviewed discharge instructions. Patient verbalized understanding. Patient received all personal belongings.

## 2022-08-06 NOTE — Plan of Care (Signed)
  Problem: Education: Goal: Knowledge of [facility] Education information/materials will improve Outcome: Progressing   Problem: Education: Goal: Mental status will improve Outcome: Progressing   Problem: Education: Goal: Verbalization of understanding the information provided will improve Outcome: Progressing   

## 2022-08-06 NOTE — Discharge Summary (Signed)
Physician Discharge Summary Note  Patient:  Nicholas Rollins is an 27 y.o., male MRN:  [id number] DOB:  12-13-1995 Patient phone:  [phone] (home)  Patient address:   [address] [place] [address],  Total Time spent with patient: 20 minutes  Date of Admission:  07/31/2022 Date of Discharge: 08-06-2022  Reason for Admission:   Nicholas Rollins is a 27 yo Caucasian male with prior mental health history of MDD, borderline personality disorder, intermittent explosive disorder, conversion disorder who presented to the [facility] urgent care on 6/27 with complaints of worsening depressive symptoms along with suicidal ideation with multiple plans for the past few months.  Patient was transferred to this behavioral health Hospital on 6/28 for treatment and stabilization of his mental status.   Principal Problem: MDD (major depressive disorder), recurrent severe, without psychosis (HCC) Discharge Diagnoses: Principal Problem:   MDD (major depressive disorder), recurrent severe, without psychosis (HCC) Active Problems:   Intermittent explosive disorder   GAD (generalized anxiety disorder)   Delta-9-tetrahydrocannabinol (THC) dependence (HCC)   Insomnia   Past Psychiatric History:  Previous Psych Diagnoses: MDD, ODD, intermittent explosive disorder, GAD, borderline personality disorder, conversion disorder. Prior inpatient treatment: Multiple at this hospital with the last 1 being in 2018. Current/prior outpatient treatment: Denies having a current outpatient provider. Prior rehab hx: Denies Psychotherapy hx: Denies History of suicide attempts: Twice as per patient, states he made attempts to cut his neck last week, no marks are visible when patient points to the area.  He also reports that he cut himself in 2017, in both forearms, and took 47 tramadol tablets that belonged to his father.  Patient extend his arms and scars are visible on both.   History  of homicide or aggression: Aggression as described above via hitting things and self when angry. Psychiatric medication history: Unable to recall most of his medications but medications taking in 2018 as per discharge summary at that time as listed above. Psychiatric medication compliance history: Noncompliant Neuromodulation history: Denies Current Psychiatrist: Denies Current therapist: Denies  Past Medical History:  Past Medical History:  Diagnosis Date   ADHD (attention deficit hyperactivity disorder)    Anxiety    Asthma    Broken ankle 2013   Left ankle   Broken wrist 2012   Left wrist   Cyst of brain 2012   Seen by Dr. Lorenso Courier, cleared for sports, found during work-up for severe headahces.  FOund on either MRI or CT scan.    Cyst of left orbit    Possible cyst of left eye; observed during routine eye exam fall 2013, "right next to nerve", was supposed to get a follow-up 03/2012.   Depression    Fatty liver November 2013   resolved with healthier nutrition and increased physical activity   Fractured rib 2011   Headache(784.0)    history of migraines, resolved this with improved overall health   Obesity    Vision abnormalities    myopia    Past Surgical History:  Procedure Laterality Date   ADENOIDECTOMY     27yo   Family History: History reviewed. No pertinent family history.  Family Psychiatric  History:  Medical: Not sure Psych: Mother with bipolar disorder Psych Rx: Unsure SA/HA: Denies Substance use family hx: Denies   Social History:  Social History   Substance and Sexual Activity  Alcohol Use No     Social History   Substance and Sexual Activity  Drug Use No  Social History   Socioeconomic History   Marital status: Single    Spouse name: Not on file   Number of children: Not on file   Years of education: Not on file   Highest education level: Not on file  Occupational History   Not on file  Tobacco Use   Smoking status: Never    Smokeless tobacco: Never  Vaping Use   Vaping Use: Every day  Substance and Sexual Activity   Alcohol use: No   Drug use: No   Sexual activity: Never  Other Topics Concern   Not on file  Social History Narrative   Not on file   Social Determinants of Health   Financial Resource Strain: Not on file  Food Insecurity: No Food Insecurity (07/31/2022)   Hunger Vital Sign    Worried About Running Out of Food in the Last Year: Never true    Ran Out of Food in the Last Year: Never true  Recent Concern: Food Insecurity - Food Insecurity Present (07/30/2022)   Hunger Vital Sign    Worried About Running Out of Food in the Last Year: Never true    Ran Out of Food in the Last Year: Sometimes true  Transportation Needs: No Transportation Needs (07/31/2022)   PRAPARE - Administrator, Civil Service (Medical): No    Lack of Transportation (Non-Medical): No  Physical Activity: Not on file  Stress: Not on file  Social Connections: Not on file    Hospital Course:   During the patient's hospitalization, patient had extensive initial psychiatric evaluation, and follow-up psychiatric evaluations every day.  Psychiatric diagnoses provided upon initial assessment:  MDD (major depressive disorder), recurrent severe, without psychosis (HCC)   Intermittent explosive disorder   GAD (generalized anxiety disorder)   Delta-9-tetrahydrocannabinol (THC) dependence (HCC)   Insomnia Pseudoseizures   Patient's psychiatric medications were adjusted on admission:  -Continue Seroquel 100 mg nightly for mood stabilization and sleep (started by admitting provider) -Start Seroquel 25 mg twice daily for anxiety -Start Tegretol 200 mg twice daily for mood stabilization  -Continue sertraline 50 mg daily for depressive symptoms (started by admitting provider) -Continue hydroxyzine 25 mg 3 times daily as needed for anxiety -Start trazodone 100 mg nightly as needed for sleep -Start clonidine 0.1 mg as  needed for SBP over 160 or DBP over 100  During the hospitalization, other adjustments were made to the patient's psychiatric medication regimen:  -tegretol was tapered off as pt has pseudoseizures -gabapentin was started at 200 mg tid for anxiety   Patient's care was discussed during the interdisciplinary team meeting every day during the hospitalization.  The patient denied having side effects to prescribed psychiatric medication.  Gradually, patient started adjusting to milieu. The patient was evaluated each day by a clinical provider to ascertain response to treatment. Improvement was noted by the patient's report of decreasing symptoms, improved sleep and appetite, affect, medication tolerance, behavior, and participation in unit programming.  Patient was asked each day to complete a self inventory noting mood, mental status, pain, new symptoms, anxiety and concerns.    Symptoms were reported as significantly decreased or resolved completely by discharge.   On day of discharge, the patient reports that their mood is stable. The patient denied having suicidal thoughts for more than 48 hours prior to discharge.  Patient denies having homicidal thoughts.  Patient denies having auditory hallucinations.  Patient denies any visual hallucinations or other symptoms of psychosis. The patient was motivated to continue  taking medication with a goal of continued improvement in mental health.   The patient reports their target psychiatric symptoms of depression and suicidal thoughts, all responded well to the psychiatric medications, and the patient reports overall benefit other psychiatric hospitalization. Supportive psychotherapy was provided to the patient. The patient also participated in regular group therapy while hospitalized. Coping skills, problem solving as well as relaxation therapies were also part of the unit programming.  Labs were reviewed with the patient, and abnormal results were  discussed with the patient.  The patient is able to verbalize their individual safety plan to this provider.  # It is recommended to the patient to continue psychiatric medications as prescribed, after discharge from the hospital.    # It is recommended to the patient to follow up with your outpatient psychiatric provider and PCP.  # It was discussed with the patient, the impact of alcohol, drugs, tobacco have been there overall psychiatric and medical wellbeing, and total abstinence from substance use was recommended the patient.ed.  # Prescriptions provided or sent directly to preferred pharmacy at discharge. Patient agreeable to plan. Given opportunity to ask questions. Appears to feel comfortable with discharge.    # In the event of worsening symptoms, the patient is instructed to call the crisis hotline, 911 and or go to the nearest ED for appropriate evaluation and treatment of symptoms. To follow-up with primary care provider for other medical issues, concerns and or health care needs  # Patient was discharged home, with a plan to follow up as noted below.   Physical Findings: AIMS:  , ,  ,  ,    CIWA:    COWS:     Aims score zero on my exam. No eps on my exam.   Musculoskeletal: Strength & Muscle Tone: within normal limits Gait & Station: normal Patient leans: N/A   Psychiatric Specialty Exam:  Presentation  General Appearance:  Appropriate for Environment; Casual; Fairly Groomed  Eye Contact: Good  Speech: Normal Rate; Clear and Coherent  Speech Volume: Normal  Handedness: Right   Mood and Affect  Mood: Euthymic  Affect: Appropriate; Congruent; Full Range   Thought Process  Thought Processes: Linear  Descriptions of Associations:Intact  Orientation:Full (Time, Place and Person)  Thought Content:Logical  History of Schizophrenia/Schizoaffective disorder:No data recorded Duration of Psychotic Symptoms:No data  recorded Hallucinations:Hallucinations: None  Ideas of Reference:None  Suicidal Thoughts:Suicidal Thoughts: No  Homicidal Thoughts:Homicidal Thoughts: No   Sensorium  Memory: Immediate Good; Recent Good; Remote Good  Judgment: Good  Insight: Good   Executive Functions  Concentration: Good  Attention Span: Good  Recall: Good  Fund of Knowledge: Good  Language: Good   Psychomotor Activity  Psychomotor Activity: Psychomotor Activity: Normal   Assets  Assets: Resilience; Social Support   Sleep  Sleep: Sleep: Fair    Physical Exam: Physical Exam Vitals reviewed.  Constitutional:      General: He is not in acute distress.    Appearance: He is normal weight. He is not toxic-appearing.  Pulmonary:     Effort: Pulmonary effort is normal. No respiratory distress.  Neurological:     Mental Status: He is alert.     Motor: No weakness.     Gait: Gait normal.  Psychiatric:        Mood and Affect: Mood normal.        Behavior: Behavior normal.        Thought Content: Thought content normal.  Judgment: Judgment normal.    Review of Systems  Constitutional:  Negative for chills and fever.  Cardiovascular:  Negative for chest pain and palpitations.  Neurological:  Negative for dizziness, tingling, tremors and headaches.  Psychiatric/Behavioral:  Negative for depression, hallucinations, memory loss, substance abuse and suicidal ideas. The patient is not nervous/anxious and does not have insomnia.   All other systems reviewed and are negative.  Blood pressure 113/79, pulse 81, temperature (!) 97.4 F (36.3 C), temperature source Oral, resp. rate 18, height 6\' 2"  (1.88 m), weight 119.7 kg, SpO2 99 %. Body mass index is 33.9 kg/m.   Social History   Tobacco Use  Smoking Status Never  Smokeless Tobacco Never   Tobacco Cessation:  N/A, patient does not currently use tobacco products   Blood Alcohol level:  Lab Results  Component Value Date    ETH <10 07/30/2022   ETH <5 10/06/2016    Metabolic Disorder Labs:  Lab Results  Component Value Date   HGBA1C 5.0 07/30/2022   MPG 97 07/30/2022   MPG 96.8 10/08/2016   Lab Results  Component Value Date   PROLACTIN 9.5 07/30/2022   PROLACTIN 22.7 (H) 10/12/2016   Lab Results  Component Value Date   CHOL 153 07/30/2022   TRIG 78 07/30/2022   HDL 54 07/30/2022   CHOLHDL 2.8 07/30/2022   VLDL 16 07/30/2022   LDLCALC 83 07/30/2022   LDLCALC 78 10/08/2016    See Psychiatric Specialty Exam and Suicide Risk Assessment completed by Attending Physician prior to discharge.  Discharge destination:  Home  Is patient on multiple antipsychotic therapies at discharge:  No   Has Patient had three or more failed trials of antipsychotic monotherapy by history:  No  Recommended Plan for Multiple Antipsychotic Therapies: NA  Discharge Instructions     Diet - low sodium heart healthy   Complete by: As directed    Increase activity slowly   Complete by: As directed       Allergies as of 08/06/2022       Reactions   Fluticasone Other (See Comments)   Hallucinations         Medication List     STOP taking these medications    zonisamide 50 MG capsule Commonly known as: ZONEGRAN       TAKE these medications      Indication  esomeprazole 40 MG capsule Commonly known as: NEXIUM Take 40 mg by mouth daily.  Indication: Heartburn   gabapentin 100 MG capsule Commonly known as: NEURONTIN Take 2 capsules (200 mg total) by mouth every 8 (eight) hours.  Indication: Generalized Anxiety Disorder   hydrOXYzine 25 MG tablet Commonly known as: ATARAX Take 1 tablet (25 mg total) by mouth 3 (three) times daily as needed for anxiety.  Indication: Feeling Anxious   nicotine 21 mg/24hr patch Commonly known as: NICODERM CQ - dosed in mg/24 hours Place 1 patch (21 mg total) onto the skin daily. Start taking on: August 07, 2022  Indication: Nicotine Addiction   propranolol 10  MG tablet Commonly known as: INDERAL Take 1 tablet (10 mg total) by mouth 3 (three) times daily.  Indication: Feeling Anxious, High Blood Pressure Disorder   QUEtiapine 100 MG tablet Commonly known as: SEROQUEL Take 1 tablet (100 mg total) by mouth at bedtime. What changed:  medication strength how much to take  Indication: Generalized Anxiety Disorder, Major Depressive Disorder   QUEtiapine 25 MG tablet Commonly known as: SEROQUEL Take 1 tablet (25 mg  total) by mouth 2 (two) times daily. What changed: You were already taking a medication with the same name, and this prescription was added. Make sure you understand how and when to take each.  Indication: Generalized Anxiety Disorder, Major Depressive Disorder   sertraline 50 MG tablet Commonly known as: ZOLOFT Take 1 tablet (50 mg total) by mouth daily. Start taking on: August 07, 2022  Indication: Major Depressive Disorder        Follow-up Information     [facility] Go on 08/10/2022.   Why: You have a hospital follow up appointment for therapy and medication management services on 08/10/22 at 8:30 am.  This appointment will be held in person. Contact information: [address] [place] [address] [phone]                 Follow-up recommendations:    Activity: as tolerated  Diet: heart healthy  Other: -Follow-up with your outpatient psychiatric provider -instructions on appointment date, time, and address (location) are provided to you in discharge paperwork.  -Take your psychiatric medications as prescribed at discharge - instructions are provided to you in the discharge paperwork  -Follow-up with outpatient primary care doctor and other specialists -for management of preventative medicine and chronic medical disease  -Testing: Follow-up with outpatient provider for abnormal lab results:  PLT 119 (low)   -If you are prescribed an atypical antipsychotic medication, we recommend that  your outpatient psychiatrist follow routine screening for side effects within 3 months of discharge, including monitoring: AIMS scale, height, weight, blood pressure, fasting lipid panel, HbA1c, and fasting blood sugar.   -Recommend total abstinence from alcohol, tobacco, and other illicit drug use at discharge.   -If your psychiatric symptoms recur, worsen, or if you have side effects to your psychiatric medications, call your outpatient psychiatric provider, 911, [phone] or go to the nearest emergency department.  -If suicidal thoughts occur, immediately call your outpatient psychiatric provider, 911, [phone] or go to the nearest emergency department.   Signed: Cristy Hilts, MD 08/06/2022, 9:51 AM  Total Time Spent in Direct Patient Care:  I personally spent 35 minutes on the unit in direct patient care. The direct patient care time included face-to-face time with the patient, reviewing the patient's chart, communicating with other professionals, and coordinating care. Greater than 50% of this time was spent in counseling or coordinating care with the patient regarding goals of hospitalization, psycho-education, and discharge planning needs.   Phineas Inches, MD Psychiatrist

## 2022-08-06 NOTE — Progress Notes (Signed)
   08/05/22 2145  Psych Admission Type (Psych Patients Only)  Admission Status Voluntary  Psychosocial Assessment  Patient Complaints None  Eye Contact Fair  Facial Expression Animated  Affect Appropriate to circumstance  Speech Logical/coherent  Interaction Assertive  Motor Activity Other (Comment)  Appearance/Hygiene Unremarkable  Behavior Characteristics Cooperative  Mood Pleasant  Thought Process  Coherency WDL  Content WDL  Delusions None reported or observed  Perception WDL  Hallucination None reported or observed  Judgment WDL  Confusion None  Danger to Self  Current suicidal ideation? Denies  Self-Injurious Behavior No self-injurious ideation or behavior indicators observed or expressed   Agreement Not to Harm Self Yes  Description of Agreement Verbal  Danger to Others  Danger to Others None reported or observed
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
